# Patient Record
Sex: Male | Born: 1960 | Race: Black or African American | Hispanic: No | Marital: Married | Smoking: Never smoker
Health system: Southern US, Community
[De-identification: ages and names within clinical notes are randomized; demographics above are authoritative.]

## PROBLEM LIST (undated history)

## (undated) DIAGNOSIS — C61 Malignant neoplasm of prostate: Secondary | ICD-10-CM

## (undated) DIAGNOSIS — I10 Essential (primary) hypertension: Secondary | ICD-10-CM

## (undated) DIAGNOSIS — J40 Bronchitis, not specified as acute or chronic: Secondary | ICD-10-CM

## (undated) DIAGNOSIS — E7800 Pure hypercholesterolemia, unspecified: Secondary | ICD-10-CM

## (undated) DIAGNOSIS — K219 Gastro-esophageal reflux disease without esophagitis: Secondary | ICD-10-CM

## (undated) DIAGNOSIS — K635 Polyp of colon: Secondary | ICD-10-CM

## (undated) DIAGNOSIS — C801 Malignant (primary) neoplasm, unspecified: Secondary | ICD-10-CM

## (undated) DIAGNOSIS — K429 Umbilical hernia without obstruction or gangrene: Secondary | ICD-10-CM

## (undated) HISTORY — PX: TONSILLECTOMY: SUR1361

## (undated) HISTORY — PX: HERNIA REPAIR: SHX51

## (undated) HISTORY — PX: COLONOSCOPY W/ BIOPSIES AND POLYPECTOMY: SHX1376

## (undated) HISTORY — PX: PROSTATE SURGERY: SHX751

---

## 2010-01-08 ENCOUNTER — Emergency Department (HOSPITAL_BASED_OUTPATIENT_CLINIC_OR_DEPARTMENT_OTHER): Admission: EM | Admit: 2010-01-08 | Discharge: 2010-01-08 | Payer: Self-pay | Admitting: Emergency Medicine

## 2010-01-19 ENCOUNTER — Ambulatory Visit: Payer: Self-pay | Admitting: Diagnostic Radiology

## 2010-01-19 ENCOUNTER — Emergency Department (HOSPITAL_BASED_OUTPATIENT_CLINIC_OR_DEPARTMENT_OTHER): Admission: EM | Admit: 2010-01-19 | Discharge: 2010-01-19 | Payer: Self-pay | Admitting: Emergency Medicine

## 2010-08-14 ENCOUNTER — Emergency Department (INDEPENDENT_AMBULATORY_CARE_PROVIDER_SITE_OTHER): Payer: No Typology Code available for payment source

## 2010-08-14 ENCOUNTER — Emergency Department (HOSPITAL_BASED_OUTPATIENT_CLINIC_OR_DEPARTMENT_OTHER)
Admission: EM | Admit: 2010-08-14 | Discharge: 2010-08-14 | Disposition: A | Discharge status: Home / Self Care | Payer: No Typology Code available for payment source | Attending: Emergency Medicine | Admitting: Emergency Medicine

## 2010-08-14 DIAGNOSIS — M25539 Pain in unspecified wrist: Secondary | ICD-10-CM

## 2010-08-14 DIAGNOSIS — I10 Essential (primary) hypertension: Secondary | ICD-10-CM | POA: Insufficient documentation

## 2010-08-14 DIAGNOSIS — S060X0A Concussion without loss of consciousness, initial encounter: Secondary | ICD-10-CM | POA: Insufficient documentation

## 2010-08-14 DIAGNOSIS — S335XXA Sprain of ligaments of lumbar spine, initial encounter: Secondary | ICD-10-CM | POA: Insufficient documentation

## 2010-08-14 DIAGNOSIS — Y92410 Unspecified street and highway as the place of occurrence of the external cause: Secondary | ICD-10-CM | POA: Insufficient documentation

## 2010-08-14 DIAGNOSIS — M542 Cervicalgia: Secondary | ICD-10-CM

## 2010-08-14 DIAGNOSIS — R51 Headache: Secondary | ICD-10-CM

## 2010-08-14 DIAGNOSIS — S139XXA Sprain of joints and ligaments of unspecified parts of neck, initial encounter: Secondary | ICD-10-CM | POA: Insufficient documentation

## 2010-08-14 DIAGNOSIS — E7800 Pure hypercholesterolemia, unspecified: Secondary | ICD-10-CM | POA: Insufficient documentation

## 2011-04-01 ENCOUNTER — Emergency Department (HOSPITAL_BASED_OUTPATIENT_CLINIC_OR_DEPARTMENT_OTHER)
Admission: EM | Admit: 2011-04-01 | Discharge: 2011-04-02 | Disposition: A | Discharge status: Home / Self Care | Payer: Self-pay | Attending: Emergency Medicine | Admitting: Emergency Medicine

## 2011-04-01 ENCOUNTER — Encounter: Payer: Self-pay | Admitting: *Deleted

## 2011-04-01 DIAGNOSIS — R1310 Dysphagia, unspecified: Secondary | ICD-10-CM | POA: Insufficient documentation

## 2011-04-01 DIAGNOSIS — I10 Essential (primary) hypertension: Secondary | ICD-10-CM | POA: Insufficient documentation

## 2011-04-01 HISTORY — DX: Bronchitis, not specified as acute or chronic: J40

## 2011-04-01 HISTORY — DX: Essential (primary) hypertension: I10

## 2011-04-01 NOTE — ED Notes (Signed)
Pt states that something flew into his mouth about 7 months ago and since then he has had increasing pain. Seen here for same.

## 2011-04-02 ENCOUNTER — Emergency Department (INDEPENDENT_AMBULATORY_CARE_PROVIDER_SITE_OTHER): Payer: Self-pay

## 2011-04-02 DIAGNOSIS — R05 Cough: Secondary | ICD-10-CM

## 2011-04-02 MED ORDER — OXYCODONE-ACETAMINOPHEN 5-325 MG PO TABS: 2.0000 | ORAL_TABLET | Freq: Three times a day (TID) | ORAL | Status: AC | PRN

## 2011-04-02 NOTE — ED Provider Notes (Signed)
History    Scribed for Cole Horn, MD, the patient was seen in room MH09/MH09. This chart was scribed by Katha Cabal.   CSN: [id number] Arrival date & time: 04/01/2011 10:45 PM   First MD Initiated Contact with Patient 04/02/11 0001      Chief Complaint  Patient presents with  . Sore Throat    (Consider location/radiation/quality/duration/timing/severity/associated sxs/prior treatment) Patient is a 50 y.o. male presenting with pharyngitis.  Sore Throat Chronicity: ongoing  Episode onset: 7 months ago  The problem occurs constantly. The problem has been gradually worsening. Pertinent negatives include no abdominal pain. Associated symptoms comments: difficulty swallowing, cough, congestion, rhinorrhea, no dental pain,. The symptoms are aggravated by swallowing.  Patient reports pain at right base of neck when swallowing.  Patient reports feeling a mild globus sensation in lower neck with mild irritation and gradual onset of odynophagia for last several months.  Patient also reports persistent cough for last several months.  Patient reports congestion and rhinorrhea for last several days.  Patient reports prior ED visit for same.  Patient has not been evaluated by ENT.  Patient has history of HTN.  There is no history of smoking.  There is no radiation of his mild neck pain and no prior treatment. His pain is mild to moderate.    Past Medical History  Diagnosis Date  . Hypertension   . Bronchitis     Past Surgical History  Procedure Date  . Prostate surgery     History reviewed. No pertinent family history.  History  Substance Use Topics  . Smoking status: Never Smoker   . Smokeless tobacco: Not on file  . Alcohol Use: No      Review of Systems  Constitutional: Negative for fever.  HENT: Positive for sore throat and rhinorrhea. Negative for trouble swallowing and dental problem.   Respiratory: Positive for cough.   Gastrointestinal: Negative for abdominal pain.    Skin: Negative for rash.  Psychiatric/Behavioral: Negative for hallucinations.    Allergies  Review of patient's allergies indicates no known allergies.  Home Medications  No current outpatient prescriptions on file.  BP 145/98  Pulse 56  Temp(Src) 98.2 F (36.8 C) (Oral)  Resp 20  Ht 5\' 11"  (1.803 m)  Wt 225 lb (102.059 kg)  BMI 31.38 kg/m2  SpO2 100%  Physical Exam  Nursing note and vitals reviewed. Constitutional:       Awake, alert, nontoxic appearance.  HENT:  Head: Atraumatic.  Eyes: Right eye exhibits no discharge. Left eye exhibits no discharge.  Neck: Neck supple. No thyromegaly present.  Cardiovascular: Normal rate and regular rhythm.   No murmur heard. Pulmonary/Chest: Effort normal and breath sounds normal. He has no wheezes. He has no rales. He exhibits no tenderness.  Abdominal: Soft. Bowel sounds are normal. There is no tenderness. There is no rebound.  Musculoskeletal: He exhibits no tenderness.       Baseline ROM, no obvious new focal weakness.  Lymphadenopathy:    He has no cervical adenopathy.  Neurological:       Mental status and motor strength appears baseline for patient and situation.  Skin: No rash noted.  Psychiatric: He has a normal mood and affect.   the patient's oropharynx is noninjected his dentition is intact and nontender he has normal jaw opening normal jaw occlusion TMJs are nontender no facial tenderness or swelling no neck tenderness or swelling or lymphadenopathy his neck is supple with good range of motion with no palpable  deformity and no thyromegaly noted.  ED Course  Procedures (including critical care time)   DIAGNOSTIC STUDIES: Oxygen Saturation is 100% on room air, normal by my interpretation.     COORDINATION OF CARE: 12:18 AM  Physical exam complete.  Will order CXR.   12:35 AM  Plan to discharge patient.  Patient agrees with plan.      LABS / RADIOLOGY:   Labs Reviewed - No data to display Dg Chest 2  View  04/02/2011  *RADIOLOGY REPORT*  Clinical Data: Cough.  CHEST - 2 VIEW  Comparison: 01/19/2010  Findings: The lungs are clear without focal infiltrate, edema, pneumothorax or pleural effusion.  Linear atelectasis or scarring is seen in the right midlung. The cardiopericardial silhouette is within normal limits for size. Imaged bony structures of the thorax are intact.  IMPRESSION: No acute findings.  Original Report Authenticated By: ERIC A. MANSELL, M.D.         MDM   I doubt any other EMC precluding discharge at this time including, but not necessarily limited to the following:deep space neck infection, airway compromise, SBI.       IMPRESSION: 1. Odynophagia        I personally performed the services described in this documentation, which was scribed in my presence. The recorded information has been reviewed and considered.             Cole Horn, MD 04/02/11 [phone]

## 2012-07-13 ENCOUNTER — Emergency Department (HOSPITAL_BASED_OUTPATIENT_CLINIC_OR_DEPARTMENT_OTHER)
Admission: EM | Admit: 2012-07-13 | Discharge: 2012-07-13 | Disposition: A | Discharge status: Home / Self Care | Payer: Self-pay | Attending: Emergency Medicine | Admitting: Emergency Medicine

## 2012-07-13 ENCOUNTER — Encounter (HOSPITAL_BASED_OUTPATIENT_CLINIC_OR_DEPARTMENT_OTHER): Payer: Self-pay | Admitting: *Deleted

## 2012-07-13 DIAGNOSIS — J00 Acute nasopharyngitis [common cold]: Secondary | ICD-10-CM | POA: Insufficient documentation

## 2012-07-13 DIAGNOSIS — I10 Essential (primary) hypertension: Secondary | ICD-10-CM | POA: Insufficient documentation

## 2012-07-13 DIAGNOSIS — Z8709 Personal history of other diseases of the respiratory system: Secondary | ICD-10-CM | POA: Insufficient documentation

## 2012-07-13 DIAGNOSIS — J029 Acute pharyngitis, unspecified: Secondary | ICD-10-CM | POA: Insufficient documentation

## 2012-07-13 DIAGNOSIS — H9209 Otalgia, unspecified ear: Secondary | ICD-10-CM | POA: Insufficient documentation

## 2012-07-13 DIAGNOSIS — Z760 Encounter for issue of repeat prescription: Secondary | ICD-10-CM | POA: Insufficient documentation

## 2012-07-13 DIAGNOSIS — J3489 Other specified disorders of nose and nasal sinuses: Secondary | ICD-10-CM | POA: Insufficient documentation

## 2012-07-13 DIAGNOSIS — J019 Acute sinusitis, unspecified: Secondary | ICD-10-CM | POA: Insufficient documentation

## 2012-07-13 MED ORDER — AMOXICILLIN 500 MG PO CAPS: 500.0000 mg | ORAL_CAPSULE | Freq: Three times a day (TID) | ORAL | Status: DC

## 2012-07-13 MED ORDER — FLUTICASONE PROPIONATE 50 MCG/ACT NA SUSP: 2.0000 | Freq: Every day | NASAL | Status: AC

## 2012-07-13 MED ORDER — AMLODIPINE BESYLATE 10 MG PO TABS: 10.0000 mg | ORAL_TABLET | Freq: Every day | ORAL | Status: AC

## 2012-07-13 NOTE — ED Provider Notes (Signed)
History     CSN: [id number]  Arrival date & time 07/13/12  2130   First MD Initiated Contact with Patient 07/13/12 2201      Chief Complaint  Patient presents with  . Cough    (Consider location/radiation/quality/duration/timing/severity/associated sxs/prior treatment) HPI Comments: Pt presents to the ED for cough and congestion x 7 days.  States he is having sinus pressure, ear pain, nasal congestion and sore throat.  Pt also complains that he is out of his blood pressure medication because he has been unable to get an appointment with his doctor.  Denies any chest pain, palpitations, SOB, nausea, vomiting, diarrhea, abdominal pain, dizziness, or weakness.  Patient is a 52 y.o. male presenting with cough. The history is provided by the patient.  Cough   Past Medical History  Diagnosis Date  . Hypertension   . Bronchitis     Past Surgical History  Procedure Laterality Date  . Prostate surgery      History reviewed. No pertinent family history.  History  Substance Use Topics  . Smoking status: Never Smoker   . Smokeless tobacco: Not on file  . Alcohol Use: No      Review of Systems  HENT: Positive for congestion and sinus pressure.   Respiratory: Positive for cough.   All other systems reviewed and are negative.    Allergies  Review of patient's allergies indicates no known allergies.  Home Medications  No current outpatient prescriptions on file.  BP 154/98  Pulse 48  Temp(Src) 97.7 F (36.5 C) (Oral)  Ht 5\' 11"  (1.803 m)  Wt 203 lb 5 oz (92.222 kg)  BMI 28.37 kg/m2  SpO2 100%  Physical Exam  Nursing note and vitals reviewed. Constitutional: He is oriented to person, place, and time. He appears well-developed and well-nourished.  HENT:  Head: Normocephalic and atraumatic.  Right Ear: Tympanic membrane and ear canal normal.  Left Ear: Tympanic membrane and ear canal normal.  Mouth/Throat: Uvula is midline, oropharynx is clear and moist and mucous  membranes are normal. No oropharyngeal exudate, posterior oropharyngeal edema, posterior oropharyngeal erythema or tonsillar abscesses.  Turbinates swollen and erythematous, mucus drainage noted in the posterior oropharynx  Eyes: Conjunctivae and EOM are normal. Pupils are equal, round, and reactive to light.  Neck: Normal range of motion.  Cardiovascular: Normal rate, regular rhythm and normal heart sounds.   Pulmonary/Chest: Effort normal and breath sounds normal. He has no wheezes.  Abdominal: Soft. Bowel sounds are normal.  Neurological: He is alert and oriented to person, place, and time.  Skin: Skin is warm and dry.  Psychiatric: He has a normal mood and affect.    ED Course  Procedures (including critical care time)  Labs Reviewed - No data to display No results found.   1. Acute sinus infection   2. Hypertension       MDM   Pt presenting with cough, cold, congestion x 7 days.  Not tried any OTC medications due to HTN.  Pt is also out of his medication- takes amlodipine 10mg  daily.  Rx amoxicillin x 10d and flonase.  Will give brief refill of HTN meds but instructed that he must follow up with his PCP for further refills.  Return precautions advised.        Garlon Hatchet, PA-C 07/13/12 2352

## 2012-07-13 NOTE — ED Notes (Signed)
Cough, cold, congestion since Friday

## 2012-07-14 NOTE — ED Provider Notes (Signed)
Medical screening examination/treatment/procedure(s) were performed by non-physician practitioner and as supervising physician I was immediately available for consultation/collaboration.   Janaisa Birkland L Michole Lecuyer, MD 07/14/12 0559 

## 2013-09-17 ENCOUNTER — Encounter (HOSPITAL_BASED_OUTPATIENT_CLINIC_OR_DEPARTMENT_OTHER): Payer: Self-pay | Admitting: Emergency Medicine

## 2013-09-17 ENCOUNTER — Emergency Department (HOSPITAL_BASED_OUTPATIENT_CLINIC_OR_DEPARTMENT_OTHER)
Admission: EM | Admit: 2013-09-17 | Discharge: 2013-09-18 | Disposition: A | Discharge status: Home / Self Care | Payer: BC Managed Care – PPO | Attending: Emergency Medicine | Admitting: Emergency Medicine

## 2013-09-17 DIAGNOSIS — K5900 Constipation, unspecified: Secondary | ICD-10-CM | POA: Insufficient documentation

## 2013-09-17 DIAGNOSIS — Z8709 Personal history of other diseases of the respiratory system: Secondary | ICD-10-CM | POA: Insufficient documentation

## 2013-09-17 DIAGNOSIS — Z860100 Personal history of colon polyps, unspecified: Secondary | ICD-10-CM | POA: Insufficient documentation

## 2013-09-17 DIAGNOSIS — Z79899 Other long term (current) drug therapy: Secondary | ICD-10-CM | POA: Insufficient documentation

## 2013-09-17 DIAGNOSIS — Z8546 Personal history of malignant neoplasm of prostate: Secondary | ICD-10-CM | POA: Insufficient documentation

## 2013-09-17 DIAGNOSIS — E7800 Pure hypercholesterolemia, unspecified: Secondary | ICD-10-CM | POA: Insufficient documentation

## 2013-09-17 DIAGNOSIS — Z791 Long term (current) use of non-steroidal anti-inflammatories (NSAID): Secondary | ICD-10-CM | POA: Insufficient documentation

## 2013-09-17 DIAGNOSIS — K602 Anal fissure, unspecified: Secondary | ICD-10-CM | POA: Insufficient documentation

## 2013-09-17 DIAGNOSIS — Z792 Long term (current) use of antibiotics: Secondary | ICD-10-CM | POA: Insufficient documentation

## 2013-09-17 DIAGNOSIS — IMO0002 Reserved for concepts with insufficient information to code with codable children: Secondary | ICD-10-CM | POA: Insufficient documentation

## 2013-09-17 DIAGNOSIS — I10 Essential (primary) hypertension: Secondary | ICD-10-CM | POA: Insufficient documentation

## 2013-09-17 DIAGNOSIS — Z8719 Personal history of other diseases of the digestive system: Secondary | ICD-10-CM | POA: Insufficient documentation

## 2013-09-17 HISTORY — DX: Malignant neoplasm of prostate: C61

## 2013-09-17 HISTORY — DX: Pure hypercholesterolemia, unspecified: E78.00

## 2013-09-17 HISTORY — DX: Polyp of colon: K63.5

## 2013-09-17 HISTORY — DX: Umbilical hernia without obstruction or gangrene: K42.9

## 2013-09-17 HISTORY — DX: Malignant (primary) neoplasm, unspecified: C80.1

## 2013-09-17 NOTE — ED Notes (Signed)
Severe rectal bleeding for 30 min that started at 7pm. .  Sts he had a colonoscopy a week ago with polyps removed.  No bleeding afterward until tonight. Pt used a preparation H suppository after bleeding episode.  Pt trembling but denies pain.

## 2013-09-18 ENCOUNTER — Encounter (HOSPITAL_BASED_OUTPATIENT_CLINIC_OR_DEPARTMENT_OTHER): Payer: Self-pay | Admitting: Emergency Medicine

## 2013-09-18 ENCOUNTER — Emergency Department (HOSPITAL_BASED_OUTPATIENT_CLINIC_OR_DEPARTMENT_OTHER): Payer: BC Managed Care – PPO

## 2013-09-18 LAB — CBC WITH DIFFERENTIAL/PLATELET
BASOS ABS: 0 10*3/uL (ref 0.0–0.1)
BASOS PCT: 0 % (ref 0–1)
EOS ABS: 0 10*3/uL (ref 0.0–0.7)
EOS PCT: 1 % (ref 0–5)
HCT: 41.7 % (ref 39.0–52.0)
Hemoglobin: 15.2 g/dL (ref 13.0–17.0)
LYMPHS ABS: 0.6 10*3/uL — AB (ref 0.7–4.0)
Lymphocytes Relative: 7 % — ABNORMAL LOW (ref 12–46)
MCH: 28.1 pg (ref 26.0–34.0)
MCHC: 36.5 g/dL — ABNORMAL HIGH (ref 30.0–36.0)
MCV: 77.1 fL — ABNORMAL LOW (ref 78.0–100.0)
Monocytes Absolute: 0.6 10*3/uL (ref 0.1–1.0)
Monocytes Relative: 7 % (ref 3–12)
NEUTROS PCT: 86 % — AB (ref 43–77)
Neutro Abs: 7.5 10*3/uL (ref 1.7–7.7)
PLATELETS: 206 10*3/uL (ref 150–400)
RBC: 5.41 MIL/uL (ref 4.22–5.81)
RDW: 12.3 % (ref 11.5–15.5)
WBC: 8.7 10*3/uL (ref 4.0–10.5)

## 2013-09-18 LAB — BASIC METABOLIC PANEL
BUN: 14 mg/dL (ref 6–23)
CALCIUM: 9.4 mg/dL (ref 8.4–10.5)
CO2: 24 mEq/L (ref 19–32)
Chloride: 102 mEq/L (ref 96–112)
Creatinine, Ser: 1.3 mg/dL (ref 0.50–1.35)
GFR, EST AFRICAN AMERICAN: 71 mL/min — AB (ref >90)
GFR, EST NON AFRICAN AMERICAN: 62 mL/min — AB (ref >90)
GLUCOSE: 105 mg/dL — AB (ref 70–99)
POTASSIUM: 3.7 meq/L (ref 3.7–5.3)
SODIUM: 139 meq/L (ref 137–147)

## 2013-09-18 LAB — OCCULT BLOOD X 1 CARD TO LAB, STOOL: Fecal Occult Bld: POSITIVE — AB

## 2013-09-18 MED ORDER — POLYETHYLENE GLYCOL 3350 17 GM/SCOOP PO POWD: 1.0000 | Freq: Once | ORAL | Status: DC

## 2013-09-18 MED ORDER — TRAMADOL HCL 50 MG PO TABS
50.0000 mg | ORAL_TABLET | Freq: Once | ORAL | Status: AC
  Administered 2013-09-18: 50 mg via ORAL
  Filled 2013-09-18: qty 1

## 2013-09-18 NOTE — Discharge Instructions (Signed)
Anal Fissure, Adult An anal fissure is a small tear or crack in the skin around the anus. Bleeding from a fissure usually stops on its own within a few minutes. However, bleeding will often reoccur with each bowel movement until the crack heals.  CAUSES   Passing large, hard stools.  Frequent diarrheal stools.  Constipation.  Inflammatory bowel disease (Crohn's disease or ulcerative colitis).  Infections.  Anal sex. SYMPTOMS   Small amounts of blood seen on your stools, on toilet paper, or in the toilet after a bowel movement.  Rectal bleeding.  Painful bowel movements.  Itching or irritation around the anus. DIAGNOSIS Your caregiver will examine the anal area. An anal fissure can usually be seen with careful inspection. A rectal exam may be performed and a short tube (anoscope) may be used to examine the anal canal. TREATMENT   You may be instructed to take fiber supplements. These supplements can soften your stool to help make bowel movements easier.  Sitz baths may be recommended to help heal the tear. Do not use soap in the sitz baths.  A medicated cream or ointment may be prescribed to lessen discomfort. HOME CARE INSTRUCTIONS   Maintain a diet high in fruits, whole grains, and vegetables. Avoid constipating foods like bananas and dairy products.  Take sitz baths as directed by your caregiver.  Drink enough fluids to keep your urine clear or pale yellow.  Only take over-the-counter or prescription medicines for pain, discomfort, or fever as directed by your caregiver. Do not take aspirin as this may increase bleeding.  Do not use ointments containing numbing medications (anesthetics) or hydrocortisone. They could slow healing. SEEK MEDICAL CARE IF:   Your fissure is not completely healed within 3 days.  You have further bleeding.  You have a fever.  You have diarrhea mixed with blood.  You have pain.  Your problem is getting worse rather than  better. MAKE SURE YOU:   Understand these instructions.  Will watch your condition.  Will get help right away if you are not doing well or get worse. Document Released: 04/09/2005 Document Revised: 07/02/2011 Document Reviewed: 09/24/2010 [facility] Patient Information 2014 [place], [place].  Fiber Content in Foods Drinking plenty of fluids and consuming foods high in fiber can help with constipation. See the list below for the fiber content of some common foods. Starches and Grains / Dietary Fiber (g)  Cheerios, 1 cup / 3 g  Kellogg's Corn Flakes, 1 cup / 0.7 g  Rice Krispies, 1  cup / 0.3 g  Quaker Oat Life Cereal,  cup / 2.1 g  Oatmeal, instant (cooked),  cup / 2 g  Kellogg's Frosted Mini Wheats, 1 cup / 5.1 g  Rice, brown, long-grain (cooked), 1 cup / 3.5 g  Rice, white, long-grain (cooked), 1 cup / 0.6 g  Macaroni, cooked, enriched, 1 cup / 2.5 g Legumes / Dietary Fiber (g)  Beans, baked, canned, plain or vegetarian,  cup / 5.2 g  Beans, kidney, canned,  cup / 6.8 g  Beans, pinto, dried (cooked),  cup / 7.7 g  Beans, pinto, canned,  cup / 5.5 g Breads and Crackers / Dietary Fiber (g)  Graham crackers, plain or honey, 2 squares / 0.7 g  Saltine crackers, 3 squares / 0.3 g  Pretzels, plain, salted, 10 pieces / 1.8 g  Bread, whole-wheat, 1 slice / 1.9 g  Bread, white, 1 slice / 0.7 g  Bread, raisin, 1 slice / 1.2 g  Bagel,  plain, 3 oz / 2 g  Tortilla, flour, 1 oz / 0.9 g  Tortilla, corn, 1 small / 1.5 g  Bun, hamburger or hotdog, 1 small / 0.9 g Fruits / Dietary Fiber (g)  Apple, raw with skin, 1 medium / 4.4 g  Applesauce, sweetened,  cup / 1.5 g  Banana,  medium / 1.5 g  Grapes, 10 grapes / 0.4 g  Orange, 1 small / 2.3 g  Raisin, 1.5 oz / 1.6 g  Melon, 1 cup / 1.4 g Vegetables / Dietary Fiber (g)  Green beans, canned,  cup / 1.3 g  Carrots (cooked),  cup / 2.3 g  Broccoli (cooked),  cup / 2.8 g  Peas, frozen (cooked),   cup / 4.4 g  Potatoes, mashed,  cup / 1.6 g  Lettuce, 1 cup / 0.5 g  Corn, canned,  cup / 1.6 g  Tomato,  cup / 1.1 g Document Released: 08/26/2006 Document Revised: 07/02/2011 Document Reviewed: 10/21/2006 [employer] Patient Information 2014 [place].  Constipation, Adult Constipation is when a person:  Poops (bowel movement) less than 3 times a week.  Has a hard time pooping.  Has poop that is dry, hard, or bigger than normal. HOME CARE   Eat more fiber, such as fruits, vegetables, whole grains like brown rice, and beans.  Eat less fatty foods and sugar. This includes [place] fries, hamburgers, cookies, candy, and soda.  If you are not getting enough fiber from food, take products with added fiber in them (supplements).  Drink enough fluid to keep your pee (urine) clear or pale yellow.  Go to the restroom when you feel like you need to poop. Do not hold it.  Only take medicine as told by your doctor. Do not take medicines that help you poop (laxatives) without talking to your doctor first.  Exercise on a regular basis, or as told by your doctor. GET HELP RIGHT AWAY IF:   You have bright red blood in your poop (stool).  Your constipation lasts more than 4 days or gets worse.  You have belly (abdomen) or butt (rectal) pain.  You have thin poop (as thin as a pencil).  You lose weight, and it cannot be explained. MAKE SURE YOU:   Understand these instructions.  Will watch your condition.  Will get help right away if you are not doing well or get worse. Document Released: 09/26/2007 Document Revised: 07/02/2011 Document Reviewed: 01/19/2013 [facility] Patient Information 2014 [place], [place].

## 2013-09-18 NOTE — ED Notes (Signed)
MD at bedside. 

## 2013-09-18 NOTE — ED Notes (Signed)
MD at bedside discussing test results and giving detailed discharge instructions.

## 2013-09-18 NOTE — ED Provider Notes (Signed)
CSN: [id number]     Arrival date & time 09/17/13  2159 History   First MD Initiated Contact with Patient 09/18/13 0000     Chief Complaint  Patient presents with  . Rectal Bleeding     (Consider location/radiation/quality/duration/timing/severity/associated sxs/prior Treatment) Patient is a 53 y.o. male presenting with hematochezia. The history is provided by the patient.  Rectal Bleeding Quality:  Bright red Amount:  Moderate Timing:  Constant Progression:  Resolved Chronicity:  New Context: constipation   Context: not diarrhea   Relieved by:  Nothing Worsened by:  Nothing tried Ineffective treatments:  None tried Associated symptoms: no abdominal pain and no vomiting   Risk factors: no anticoagulant use   Was straining to have BM as has had hard stool for months.  After 30 minutes of straining had red blood in toilet  Past Medical History  Diagnosis Date  . Hypertension   . Bronchitis   . Colon polyps   . Cancer   . Prostate cancer   . High cholesterol   . Umbilical hernia    Past Surgical History  Procedure Laterality Date  . Prostate surgery    . Colonoscopy w/ biopsies and polypectomy    . Hernia repair     No family history on file. History  Substance Use Topics  . Smoking status: Never Smoker   . Smokeless tobacco: Not on file  . Alcohol Use: No    Review of Systems  Gastrointestinal: Positive for hematochezia. Negative for vomiting and abdominal pain.  All other systems reviewed and are negative.     Allergies  Iodine and Ivp dye  Home Medications   Prior to Admission medications   Medication Sig Start Date End Date Taking? Authorizing Provider  atorvastatin (LIPITOR) 40 MG tablet Take 40 mg by mouth daily.   Yes Historical Provider, MD  meloxicam (MOBIC) 15 MG tablet Take 15 mg by mouth daily.   Yes Historical Provider, MD  amLODipine (NORVASC) 10 MG tablet Take 1 tablet (10 mg total) by mouth daily. 07/13/12   Larene Pickett, PA-C   amoxicillin (AMOXIL) 500 MG capsule Take 1 capsule (500 mg total) by mouth 3 (three) times daily. 07/13/12   Larene Pickett, PA-C  fluticasone (FLONASE) 50 MCG/ACT nasal spray Place 2 sprays into the nose daily. 07/13/12   Larene Pickett, PA-C   BP 133/78  Pulse 75  Temp(Src) 99.5 F (37.5 C) (Oral)  Resp 22  Ht 5\' 11"  (1.803 m)  Wt 208 lb (94.348 kg)  BMI 29.02 kg/m2  SpO2 99% Physical Exam  Constitutional: He is oriented to person, place, and time. He appears well-developed and well-nourished. No distress.  HENT:  Head: Normocephalic and atraumatic.  Mouth/Throat: Oropharynx is clear and moist.  Eyes: Conjunctivae are normal. Pupils are equal, round, and reactive to light.  Neck: Normal range of motion. Neck supple.  Cardiovascular: Normal rate, regular rhythm and intact distal pulses.   Pulmonary/Chest: Effort normal and breath sounds normal. He has no wheezes. He has no rales.  Abdominal: Soft. Bowel sounds are normal. There is no tenderness. There is no rebound and no guarding.  Genitourinary: Guaiac positive stool.  Anal fissure with hard stool in vault  Musculoskeletal: Normal range of motion.  Neurological: He is alert and oriented to person, place, and time.  Skin: Skin is warm and dry.  Psychiatric: He has a normal mood and affect.    ED Course  Procedures (including critical care time) Labs Review Labs  Reviewed  OCCULT BLOOD X 1 CARD TO LAB, STOOL - Abnormal; Notable for the following:    Fecal Occult Bld POSITIVE (*)    All other components within normal limits  CBC WITH DIFFERENTIAL - Abnormal; Notable for the following:    MCV 77.1 (*)    MCHC 36.5 (*)    Neutrophils Relative % 86 (*)    Lymphocytes Relative 7 (*)    Lymphs Abs 0.6 (*)    All other components within normal limits  BASIC METABOLIC PANEL - Abnormal; Notable for the following:    Glucose, Bld 105 (*)    GFR calc non Af Amer 62 (*)    GFR calc Af Amer 71 (*)    All other components within  normal limits    Imaging Review Dg Abd Acute W/chest  09/18/2013   CLINICAL DATA:  Abdominal pain, constipation.  EXAM: ACUTE ABDOMEN SERIES (ABDOMEN 2 VIEW & CHEST 1 VIEW)  COMPARISON:  Prior radiograph from 04/02/2011  FINDINGS: The cardiac and mediastinal silhouettes are stable in size and contour, and remain within normal limits.  The lungs are normally inflated. No airspace consolidation, pleural effusion, or pulmonary edema is identified. There is no pneumothorax.  No evidence of obstruction or ileus. Moderate to large amount of retained stool seen diffusely throughout the colon, suggesting constipation. No abnormal bowel Tayag thickening. No free intraperitoneal air. No soft tissue mass or abnormal calcification. Sequelae of prior prostate surgery overlie the pelvis.  No acute osseous abnormality.  IMPRESSION: 1. Nonobstructive bowel gas pattern. Large amount of retained stool throughout the colon, suggesting constipation. 2. No acute cardiopulmonary abnormality identified.   Electronically Signed   By: Jeannine Boga M.D.   On: 09/18/2013 01:38     EKG Interpretation None      MDM   Final diagnoses:  None    Anal fissure. Hb is high normal.  Follow up with your doctor in the am start miralax daily.  Return for weakness fever abdominal pain or intractable bleeding   Quintus Premo K Tali Cleaves-Rasch, MD 09/18/13 [id number]

## 2014-10-13 ENCOUNTER — Other Ambulatory Visit: Payer: Self-pay

## 2014-10-13 ENCOUNTER — Emergency Department (HOSPITAL_BASED_OUTPATIENT_CLINIC_OR_DEPARTMENT_OTHER)
Admission: EM | Admit: 2014-10-13 | Discharge: 2014-10-14 | Disposition: A | Discharge status: Home / Self Care | Payer: BLUE CROSS/BLUE SHIELD | Attending: Emergency Medicine | Admitting: Emergency Medicine

## 2014-10-13 ENCOUNTER — Encounter (HOSPITAL_BASED_OUTPATIENT_CLINIC_OR_DEPARTMENT_OTHER): Payer: Self-pay | Admitting: *Deleted

## 2014-10-13 DIAGNOSIS — Z79899 Other long term (current) drug therapy: Secondary | ICD-10-CM | POA: Diagnosis not present

## 2014-10-13 DIAGNOSIS — I10 Essential (primary) hypertension: Secondary | ICD-10-CM | POA: Insufficient documentation

## 2014-10-13 DIAGNOSIS — J40 Bronchitis, not specified as acute or chronic: Secondary | ICD-10-CM | POA: Insufficient documentation

## 2014-10-13 DIAGNOSIS — Z8639 Personal history of other endocrine, nutritional and metabolic disease: Secondary | ICD-10-CM | POA: Insufficient documentation

## 2014-10-13 DIAGNOSIS — Z8546 Personal history of malignant neoplasm of prostate: Secondary | ICD-10-CM | POA: Diagnosis not present

## 2014-10-13 DIAGNOSIS — Z7951 Long term (current) use of inhaled steroids: Secondary | ICD-10-CM | POA: Insufficient documentation

## 2014-10-13 DIAGNOSIS — Z86010 Personal history of colonic polyps: Secondary | ICD-10-CM | POA: Diagnosis not present

## 2014-10-13 DIAGNOSIS — R05 Cough: Secondary | ICD-10-CM | POA: Diagnosis present

## 2014-10-13 DIAGNOSIS — Z8719 Personal history of other diseases of the digestive system: Secondary | ICD-10-CM | POA: Diagnosis not present

## 2014-10-13 NOTE — ED Notes (Addendum)
Pt c/o cough x 1 year  C/o SOB and chest pain x 2 days

## 2014-10-14 ENCOUNTER — Emergency Department (HOSPITAL_BASED_OUTPATIENT_CLINIC_OR_DEPARTMENT_OTHER): Payer: BLUE CROSS/BLUE SHIELD

## 2014-10-14 LAB — CBC
HEMATOCRIT: 43.2 % (ref 39.0–52.0)
HEMOGLOBIN: 15.4 g/dL (ref 13.0–17.0)
MCH: 27.1 pg (ref 26.0–34.0)
MCHC: 35.6 g/dL (ref 30.0–36.0)
MCV: 76.1 fL — AB (ref 78.0–100.0)
Platelets: 224 10*3/uL (ref 150–400)
RBC: 5.68 MIL/uL (ref 4.22–5.81)
RDW: 12.6 % (ref 11.5–15.5)
WBC: 5.6 10*3/uL (ref 4.0–10.5)

## 2014-10-14 LAB — BASIC METABOLIC PANEL
Anion gap: 11 (ref 5–15)
BUN: 13 mg/dL (ref 6–20)
CHLORIDE: 102 mmol/L (ref 101–111)
CO2: 26 mmol/L (ref 22–32)
CREATININE: 1.29 mg/dL — AB (ref 0.61–1.24)
Calcium: 9.2 mg/dL (ref 8.9–10.3)
GFR calc non Af Amer: >60 mL/min (ref >60)
GLUCOSE: 110 mg/dL — AB (ref 65–99)
Potassium: 3.7 mmol/L (ref 3.5–5.1)
Sodium: 139 mmol/L (ref 135–145)

## 2014-10-14 LAB — TROPONIN I: Troponin I: <0.03 ng/mL (ref <0.031)

## 2014-10-14 MED ORDER — ALBUTEROL SULFATE HFA 108 (90 BASE) MCG/ACT IN AERS
2.0000 | INHALATION_SPRAY | RESPIRATORY_TRACT | Status: DC | PRN
  Administered 2014-10-14: 2 via RESPIRATORY_TRACT
  Filled 2014-10-14: qty 6.7

## 2014-10-14 NOTE — ED Notes (Signed)
Patient transported to X-ray 

## 2014-10-14 NOTE — ED Provider Notes (Signed)
CSN: [id number]     Arrival date & time 10/13/14  2333 History   First MD Initiated Contact with Patient 10/14/14 [phone]     Chief Complaint  Patient presents with  . Cough     (Consider location/radiation/quality/duration/timing/severity/associated sxs/prior Treatment) HPI  This is a 54 year old male with a history of cough for over a year. The cough tends to be episodic. He has been diagnosed with bronchitis in the past and has a remote history of inhaler use. Symptoms are moderate and he is not aware of any particular triggers. He has also been having some back discomfort which she describes as spasms or tenseness. He states this discomfort is "not bad". This is located in his mid upper back. It is not worse with coughing. He is not a smoker.  Past Medical History  Diagnosis Date  . Hypertension   . Bronchitis   . Colon polyps   . Cancer   . Prostate cancer   . High cholesterol   . Umbilical hernia    Past Surgical History  Procedure Laterality Date  . Prostate surgery    . Colonoscopy w/ biopsies and polypectomy    . Hernia repair     History reviewed. No pertinent family history. History  Substance Use Topics  . Smoking status: Never Smoker   . Smokeless tobacco: Not on file  . Alcohol Use: No    Review of Systems  All other systems reviewed and are negative.   Allergies  Iodine and Ivp dye  Home Medications   Prior to Admission medications   Medication Sig Start Date End Date Taking? Authorizing Provider  ranitidine (ZANTAC) 150 MG tablet Take 150 mg by mouth 2 (two) times daily.   Yes Historical Provider, MD  amLODipine (NORVASC) 10 MG tablet Take 1 tablet (10 mg total) by mouth daily. 07/13/12   Larene Pickett, PA-C  fluticasone (FLONASE) 50 MCG/ACT nasal spray Place 2 sprays into the nose daily. 07/13/12   Larene Pickett, PA-C   BP 130/84 mmHg  Pulse 48  Temp(Src) 98.1 F (36.7 C)  Resp 18  Wt 212 lb (96.163 kg)  SpO2 98%   Physical Exam  General:  Well-developed, well-nourished male in no acute distress; appearance consistent with age of record HENT: normocephalic; atraumatic Eyes: pupils equal, round and reactive to light; extraocular muscles intact Neck: supple Heart: regular rate and rhythm Lungs: Mildly decreased air movement bilaterally; frequent cough Abdomen: soft; nondistended; nontender; bowel sounds present Back: Nontender Extremities: No deformity; full range of motion; pulses normal Neurologic: Awake, alert and oriented; motor function intact in all extremities and symmetric; no facial droop Skin: Warm and dry Psychiatric: Normal mood and affect    ED Course  Procedures (including critical care time)   EKG Interpretation   Date/Time:  Wednesday October 13 2014 23:50:17 EDT Ventricular Rate:  48 PR Interval:  154 QRS Duration: 96 QT Interval:  444 QTC Calculation: 396 R Axis:   26 Text Interpretation:  Sinus bradycardia Otherwise normal ECG No previous  ECGs available Confirmed by Deanthony Maull  MD, Jenny Reichmann ([id number]) on 10/13/2014 11:58:23  PM      MDM   Nursing notes and vitals signs, including pulse oximetry, reviewed.  Summary of this visit's results, reviewed by myself:  Labs:  Results for orders placed or performed during the hospital encounter of 10/13/14 (from the past 24 hour(s))  Troponin I     Status: None   Collection Time: 10/14/14 12:05 AM  Result Value  Ref Range   Troponin I <0.03 <0.031 ng/mL  Basic metabolic panel     Status: Abnormal   Collection Time: 10/14/14  1:10 AM  Result Value Ref Range   Sodium 139 135 - 145 mmol/L   Potassium 3.7 3.5 - 5.1 mmol/L   Chloride 102 101 - 111 mmol/L   CO2 26 22 - 32 mmol/L   Glucose, Bld 110 (H) 65 - 99 mg/dL   BUN 13 6 - 20 mg/dL   Creatinine, Ser 1.29 (H) 0.61 - 1.24 mg/dL   Calcium 9.2 8.9 - 10.3 mg/dL   GFR calc non Af Amer >60 >60 mL/min   GFR calc Af Amer >60 >60 mL/min   Anion gap 11 5 - 15  CBC     Status: Abnormal   Collection Time: 10/14/14   1:10 AM  Result Value Ref Range   WBC 5.6 4.0 - 10.5 K/uL   RBC 5.68 4.22 - 5.81 MIL/uL   Hemoglobin 15.4 13.0 - 17.0 g/dL   HCT 43.2 39.0 - 52.0 %   MCV 76.1 (L) 78.0 - 100.0 fL   MCH 27.1 26.0 - 34.0 pg   MCHC 35.6 30.0 - 36.0 g/dL   RDW 12.6 11.5 - 15.5 %   Platelets 224 150 - 400 K/uL    Imaging Studies: Dg Chest 2 View  10/14/2014   CLINICAL DATA:  Acute onset of shortness of breath and generalized chest pain. Cough. Initial encounter.  EXAM: CHEST  2 VIEW  COMPARISON:  Chest radiograph performed 09/18/2013  FINDINGS: The lungs are well-aerated and clear. There is no evidence of focal opacification, pleural effusion or pneumothorax.  The heart is normal in size; the mediastinal contour is within normal limits. No acute osseous abnormalities are seen.  IMPRESSION: No acute cardiopulmonary process seen.   Electronically Signed   By: Garald Balding M.D.   On: 10/14/2014 02:23       Shanon Rosser, MD 10/14/14 [phone]

## 2014-10-14 NOTE — ED Notes (Signed)
Patient ambulatory to bathroom.

## 2015-02-16 ENCOUNTER — Encounter (HOSPITAL_BASED_OUTPATIENT_CLINIC_OR_DEPARTMENT_OTHER): Payer: Self-pay

## 2015-02-16 ENCOUNTER — Emergency Department (HOSPITAL_BASED_OUTPATIENT_CLINIC_OR_DEPARTMENT_OTHER): Payer: Managed Care, Other (non HMO)

## 2015-02-16 ENCOUNTER — Emergency Department (HOSPITAL_BASED_OUTPATIENT_CLINIC_OR_DEPARTMENT_OTHER)
Admission: EM | Admit: 2015-02-16 | Discharge: 2015-02-17 | Disposition: A | Discharge status: Home / Self Care | Payer: Managed Care, Other (non HMO) | Attending: Emergency Medicine | Admitting: Emergency Medicine

## 2015-02-16 DIAGNOSIS — Z86010 Personal history of colonic polyps: Secondary | ICD-10-CM | POA: Diagnosis not present

## 2015-02-16 DIAGNOSIS — Z79899 Other long term (current) drug therapy: Secondary | ICD-10-CM | POA: Diagnosis not present

## 2015-02-16 DIAGNOSIS — K219 Gastro-esophageal reflux disease without esophagitis: Secondary | ICD-10-CM | POA: Insufficient documentation

## 2015-02-16 DIAGNOSIS — M5412 Radiculopathy, cervical region: Secondary | ICD-10-CM | POA: Diagnosis not present

## 2015-02-16 DIAGNOSIS — I10 Essential (primary) hypertension: Secondary | ICD-10-CM | POA: Diagnosis not present

## 2015-02-16 DIAGNOSIS — Z7951 Long term (current) use of inhaled steroids: Secondary | ICD-10-CM | POA: Insufficient documentation

## 2015-02-16 DIAGNOSIS — Z8546 Personal history of malignant neoplasm of prostate: Secondary | ICD-10-CM | POA: Diagnosis not present

## 2015-02-16 DIAGNOSIS — R079 Chest pain, unspecified: Secondary | ICD-10-CM | POA: Diagnosis present

## 2015-02-16 DIAGNOSIS — Z8639 Personal history of other endocrine, nutritional and metabolic disease: Secondary | ICD-10-CM | POA: Insufficient documentation

## 2015-02-16 DIAGNOSIS — J40 Bronchitis, not specified as acute or chronic: Secondary | ICD-10-CM | POA: Diagnosis not present

## 2015-02-16 HISTORY — DX: Gastro-esophageal reflux disease without esophagitis: K21.9

## 2015-02-16 LAB — BASIC METABOLIC PANEL
Anion gap: 3 — ABNORMAL LOW (ref 5–15)
BUN: 19 mg/dL (ref 6–20)
CHLORIDE: 110 mmol/L (ref 101–111)
CO2: 26 mmol/L (ref 22–32)
CREATININE: 1.35 mg/dL — AB (ref 0.61–1.24)
Calcium: 8.9 mg/dL (ref 8.9–10.3)
GFR calc Af Amer: >60 mL/min (ref >60)
GFR calc non Af Amer: 58 mL/min — ABNORMAL LOW (ref >60)
Glucose, Bld: 90 mg/dL (ref 65–99)
Potassium: 4.2 mmol/L (ref 3.5–5.1)
Sodium: 139 mmol/L (ref 135–145)

## 2015-02-16 LAB — CBC
HEMATOCRIT: 41.5 % (ref 39.0–52.0)
Hemoglobin: 14.6 g/dL (ref 13.0–17.0)
MCH: 26.8 pg (ref 26.0–34.0)
MCHC: 35.2 g/dL (ref 30.0–36.0)
MCV: 76.3 fL — ABNORMAL LOW (ref 78.0–100.0)
PLATELETS: 257 10*3/uL (ref 150–400)
RBC: 5.44 MIL/uL (ref 4.22–5.81)
RDW: 12.6 % (ref 11.5–15.5)
WBC: 7.1 10*3/uL (ref 4.0–10.5)

## 2015-02-16 LAB — TROPONIN I: Troponin I: <0.03 ng/mL (ref <0.031)

## 2015-02-16 MED ORDER — HYDROCOD POLST-CPM POLST ER 10-8 MG/5ML PO SUER: 5.0000 mL | Freq: Two times a day (BID) | ORAL | Status: AC | PRN

## 2015-02-16 MED ORDER — ALBUTEROL SULFATE HFA 108 (90 BASE) MCG/ACT IN AERS
2.0000 | INHALATION_SPRAY | RESPIRATORY_TRACT | Status: DC | PRN
  Administered 2015-02-17: 2 via RESPIRATORY_TRACT
  Filled 2015-02-16: qty 6.7

## 2015-02-16 NOTE — ED Notes (Signed)
C/o chest and neck pain x 2-3 days

## 2015-02-16 NOTE — ED Provider Notes (Signed)
CSN: [id number]     Arrival date & time 02/16/15  2233 History  By signing my name below, I, Terrance Branch, attest that this documentation has been prepared under the direction and in the presence of Shanon Rosser, MD. Electronically Signed: Randa Evens, ED Scribe. 02/16/2015. 11:52 PM.    Chief Complaint  Patient presents with  . Chest Pain   The history is provided by the patient. No language interpreter was used.   HPI Comments: Cole Chung is a 54 y.o. male who presents to the Emergency Department complaining of mild, dull central CP onset 1 week. Pt reports cough for the past two weeks that makes his pain worse. Symptoms are consistent with previous episodes of bronchitis. Pt also reports neck stiffness with intermittent tinging in his left arm, worse with movement of his neck. Pt denies wheezing, nausea or vomiting.    Past Medical History  Diagnosis Date  . Hypertension   . Bronchitis   . Colon polyps   . Cancer ([place])   . Prostate cancer ([place])   . High cholesterol   . Umbilical hernia   . Acid reflux    Past Surgical History  Procedure Laterality Date  . Prostate surgery    . Colonoscopy w/ biopsies and polypectomy    . Hernia repair     No family history on file. Social History  Substance Use Topics  . Smoking status: Never Smoker   . Smokeless tobacco: None  . Alcohol Use: No    Review of Systems A complete 10 system review of systems was obtained and all systems are negative except as noted in the HPI and PMH.     Allergies  Iodine and Ivp dye  Home Medications   Prior to Admission medications   Medication Sig Start Date End Date Taking? Authorizing Provider  amLODipine (NORVASC) 10 MG tablet Take 1 tablet (10 mg total) by mouth daily. 07/13/12   Larene Pickett, PA-C  chlorpheniramine-HYDROcodone (TUSSIONEX PENNKINETIC ER) 10-8 MG/5ML SUER Take 5 mLs by mouth every 12 (twelve) hours as needed for cough. 02/16/15   Jamie-Lee Galdamez, MD  fluticasone  (FLONASE) 50 MCG/ACT nasal spray Place 2 sprays into the nose daily. 07/13/12   Larene Pickett, PA-C  ranitidine (ZANTAC) 150 MG tablet Take 150 mg by mouth 2 (two) times daily.    Historical Provider, MD   BP 134/82 mmHg  Pulse 54  Temp(Src) 98 F (36.7 C) (Oral)  Resp 21  Ht 5\' 11"  (1.803 m)  Wt 208 lb (94.348 kg)  BMI 29.02 kg/m2  SpO2 97%   Physical Exam General: Well-developed, well-nourished male in no acute distress; appearance consistent with age of record HENT: normocephalic; atraumatic Eyes: pupils equal, round and reactive to light; extraocular muscles intact Neck: supple; left-sided soft tissue tenderness; tingling reproduced with movement of the neck Heart: regular rate and rhythm; no murmurs, rubs or gallops Lungs: clear to auscultation bilaterally Abdomen: soft; nondistended; nontender; no masses or hepatosplenomegaly; bowel sounds present Extremities: No deformity; full range of motion; pulses normal Neurologic: Awake, alert and oriented; motor function intact in all extremities and symmetric; no facial droop Skin: Warm and dry Psychiatric: Normal mood and affect  ED Course  Procedures (including critical care time) DIAGNOSTIC STUDIES: Oxygen Saturation is 97% on RA, normal by my interpretation.    COORDINATION OF CARE: 11:52 PM-Discussed treatment plan with pt at bedside and pt agreed to plan.    EKG Interpretation   Date/Time:  Wednesday February 16 2015 22:42:22 EDT Ventricular Rate:  52 PR Interval:  144 QRS Duration: 90 QT Interval:  430 QTC Calculation: 399 R Axis:   11 Text Interpretation:  Sinus bradycardia Otherwise normal ECG Unchanged  Confirmed by Florina Ou  MD, Jenny Reichmann ([id number]) on 02/16/2015 11:43:02 PM      MDM   Nursing notes and vitals signs, including pulse oximetry, reviewed.  Summary of this visit's results, reviewed by myself:  Labs:  Results for orders placed or performed during the hospital encounter of 02/16/15 (from the past 24  hour(s))  Basic metabolic panel     Status: Abnormal   Collection Time: 02/16/15 10:50 PM  Result Value Ref Range   Sodium 139 135 - 145 mmol/L   Potassium 4.2 3.5 - 5.1 mmol/L   Chloride 110 101 - 111 mmol/L   CO2 26 22 - 32 mmol/L   Glucose, Bld 90 65 - 99 mg/dL   BUN 19 6 - 20 mg/dL   Creatinine, Ser 1.35 (H) 0.61 - 1.24 mg/dL   Calcium 8.9 8.9 - 10.3 mg/dL   GFR calc non Af Amer 58 (L) >60 mL/min   GFR calc Af Amer >60 >60 mL/min   Anion gap 3 (L) 5 - 15  CBC     Status: Abnormal   Collection Time: 02/16/15 10:50 PM  Result Value Ref Range   WBC 7.1 4.0 - 10.5 K/uL   RBC 5.44 4.22 - 5.81 MIL/uL   Hemoglobin 14.6 13.0 - 17.0 g/dL   HCT 41.5 39.0 - 52.0 %   MCV 76.3 (L) 78.0 - 100.0 fL   MCH 26.8 26.0 - 34.0 pg   MCHC 35.2 30.0 - 36.0 g/dL   RDW 12.6 11.5 - 15.5 %   Platelets 257 150 - 400 K/uL  Troponin I     Status: None   Collection Time: 02/16/15 10:50 PM  Result Value Ref Range   Troponin I <0.03 <0.031 ng/mL    Imaging Studies: Dg Chest 2 View  02/16/2015  CLINICAL DATA:  54 year old male with intermittent midline chest pain for the past 2 days with cough and cold like symptoms. Left-sided neck stiffness and left arm tingling for 1 week. EXAM: CHEST  2 VIEW COMPARISON:  Chest x-ray 10/14/2014. FINDINGS: Lung volumes are normal. No consolidative airspace disease. No pleural effusions. No pneumothorax. No pulmonary nodule or mass noted. Pulmonary vasculature and the cardiomediastinal silhouette are within normal limits. IMPRESSION: No radiographic evidence of acute cardiopulmonary disease. Electronically Signed   By: Vinnie Langton M.D.   On: 02/16/2015 23:18     Final diagnoses:  Bronchitis  Cervical radiculopathy   I personally performed the services described in this documentation, which was scribed in my presence. The recorded information has been reviewed and is accurate.      Shanon Rosser, MD 02/16/15 [phone]

## 2015-02-16 NOTE — ED Notes (Signed)
Intermittent midline chest pain since Monday with cough and cold symptoms.  Pt also c/o left side neck stiffness with left arm tingling since last week.

## 2015-02-17 NOTE — ED Notes (Signed)
Pt verbalizes understanding of d/c instructions and denies any further needs at this time. 

## 2015-07-26 ENCOUNTER — Emergency Department (HOSPITAL_BASED_OUTPATIENT_CLINIC_OR_DEPARTMENT_OTHER)
Admission: EM | Admit: 2015-07-26 | Discharge: 2015-07-26 | Disposition: A | Discharge status: Home / Self Care | Payer: 59 | Attending: Emergency Medicine | Admitting: Emergency Medicine

## 2015-07-26 ENCOUNTER — Encounter (HOSPITAL_BASED_OUTPATIENT_CLINIC_OR_DEPARTMENT_OTHER): Payer: Self-pay | Admitting: Emergency Medicine

## 2015-07-26 ENCOUNTER — Emergency Department (HOSPITAL_BASED_OUTPATIENT_CLINIC_OR_DEPARTMENT_OTHER): Payer: 59

## 2015-07-26 DIAGNOSIS — Z8639 Personal history of other endocrine, nutritional and metabolic disease: Secondary | ICD-10-CM | POA: Insufficient documentation

## 2015-07-26 DIAGNOSIS — K219 Gastro-esophageal reflux disease without esophagitis: Secondary | ICD-10-CM | POA: Diagnosis not present

## 2015-07-26 DIAGNOSIS — Z86010 Personal history of colonic polyps: Secondary | ICD-10-CM | POA: Insufficient documentation

## 2015-07-26 DIAGNOSIS — R059 Cough, unspecified: Secondary | ICD-10-CM

## 2015-07-26 DIAGNOSIS — R69 Illness, unspecified: Secondary | ICD-10-CM

## 2015-07-26 DIAGNOSIS — R0789 Other chest pain: Secondary | ICD-10-CM | POA: Diagnosis not present

## 2015-07-26 DIAGNOSIS — Z7951 Long term (current) use of inhaled steroids: Secondary | ICD-10-CM | POA: Insufficient documentation

## 2015-07-26 DIAGNOSIS — Z8546 Personal history of malignant neoplasm of prostate: Secondary | ICD-10-CM | POA: Diagnosis not present

## 2015-07-26 DIAGNOSIS — M545 Low back pain: Secondary | ICD-10-CM | POA: Insufficient documentation

## 2015-07-26 DIAGNOSIS — Z79899 Other long term (current) drug therapy: Secondary | ICD-10-CM | POA: Insufficient documentation

## 2015-07-26 DIAGNOSIS — R05 Cough: Secondary | ICD-10-CM

## 2015-07-26 DIAGNOSIS — J111 Influenza due to unidentified influenza virus with other respiratory manifestations: Secondary | ICD-10-CM | POA: Diagnosis not present

## 2015-07-26 DIAGNOSIS — R300 Dysuria: Secondary | ICD-10-CM | POA: Diagnosis not present

## 2015-07-26 DIAGNOSIS — I10 Essential (primary) hypertension: Secondary | ICD-10-CM | POA: Insufficient documentation

## 2015-07-26 DIAGNOSIS — R109 Unspecified abdominal pain: Secondary | ICD-10-CM | POA: Diagnosis not present

## 2015-07-26 DIAGNOSIS — R319 Hematuria, unspecified: Secondary | ICD-10-CM | POA: Diagnosis not present

## 2015-07-26 LAB — URINALYSIS, ROUTINE W REFLEX MICROSCOPIC
Bilirubin Urine: NEGATIVE
GLUCOSE, UA: NEGATIVE mg/dL
Ketones, ur: 15 mg/dL — AB
Nitrite: NEGATIVE
Protein, ur: NEGATIVE mg/dL
SPECIFIC GRAVITY, URINE: 1.029 (ref 1.005–1.030)
pH: 6 (ref 5.0–8.0)

## 2015-07-26 LAB — URINE MICROSCOPIC-ADD ON

## 2015-07-26 MED ORDER — ALBUTEROL SULFATE HFA 108 (90 BASE) MCG/ACT IN AERS
4.0000 | INHALATION_SPRAY | Freq: Once | RESPIRATORY_TRACT | Status: AC
  Administered 2015-07-26: 4 via RESPIRATORY_TRACT
  Filled 2015-07-26: qty 6.7

## 2015-07-26 MED ORDER — AEROCHAMBER PLUS FLO-VU LARGE MISC
1.0000 | Freq: Once | Status: DC
  Filled 2015-07-26: qty 1

## 2015-07-26 MED ORDER — PREDNISONE 10 MG PO TABS: 50.0000 mg | ORAL_TABLET | Freq: Every day | ORAL | Status: DC

## 2015-07-26 MED ORDER — CEPHALEXIN 500 MG PO CAPS: 500.0000 mg | ORAL_CAPSULE | Freq: Two times a day (BID) | ORAL | Status: DC

## 2015-07-26 MED ORDER — PREDNISONE 10 MG PO TABS
60.0000 mg | ORAL_TABLET | Freq: Once | ORAL | Status: AC
  Administered 2015-07-26: 60 mg via ORAL
  Filled 2015-07-26: qty 1

## 2015-07-26 NOTE — ED Notes (Signed)
Patient states that he has had generalized body aches and cough since Friday. The patient reports that the is also having a lot blood in his urine and would like to be checked for a UTI

## 2015-07-26 NOTE — Discharge Instructions (Signed)
You likely have a flulike illness. Continue drinking plenty of fluids and taking Tylenol as needed for fever and pain. Return without fail for worsening symptoms including difficulty breathing, worsening pain. Use her inhaler as needed for your bronchospastic cough with take steroids as prescribed. Urine was not convincing for urinary tract infection, however it is sent for culture. I you are written for a course of antibiotics given that he stated this is consistent with your prior urinary tract infections. Please follow-up with urologist as well as your primary care provider for reevaluation.  Cough, Adult A cough helps to clear your throat and lungs. A cough may last only 2-3 weeks (acute), or it may last longer than 8 weeks (chronic). Many different things can cause a cough. A cough may be a sign of an illness or another medical condition. HOME CARE  Pay attention to any changes in your cough.  Take medicines only as told by your doctor.  If you were prescribed an antibiotic medicine, take it as told by your doctor. Do not stop taking it even if you start to feel better.  Talk with your doctor before you try using a cough medicine.  Drink enough fluid to keep your pee (urine) clear or pale yellow.  If the air is dry, use a cold steam vaporizer or humidifier in your home.  Stay away from things that make you cough at work or at home.  If your cough is worse at night, try using extra pillows to raise your head up higher while you sleep.  Do not smoke, and try not to be around smoke. If you need help quitting, ask your doctor.  Do not have caffeine.  Do not drink alcohol.  Rest as needed. GET HELP IF:  You have new problems (symptoms).  You cough up yellow fluid (pus).  Your cough does not get better after 2-3 weeks, or your cough gets worse.  Medicine does not help your cough and you are not sleeping well.  You have pain that gets worse or pain that is not helped with  medicine.  You have a fever.  You are losing weight and you do not know why.  You have night sweats. GET HELP RIGHT AWAY IF:  You cough up blood.  You have trouble breathing.  Your heartbeat is very fast.   This information is not intended to replace advice given to you by your health care provider. Make sure you discuss any questions you have with your health care provider.   Document Released: 12/21/2010 Document Revised: 12/29/2014 Document Reviewed: 06/16/2014 Elsevier Interactive Patient Education 2016 Else[employer]  Influenza, Adult Influenza (flu) is an infection in the mouth, nose, and throat (respiratory tract) caused by a virus. The flu can make you feel very ill. Influenza spreads easily from person to person (contagious).  HOME CARE   Only take medicines as told by your doctor.  Use a cool mist humidifier to make breathing easier.  Get plenty of rest until your fever goes away. This usually takes 3 to 4 days.  Drink enough fluids to keep your pee (urine) clear or pale yellow.  Cover your mouth and nose when you cough or sneeze.  Wash your hands well to avoid spreading the flu.  Stay home from work or school until your fever has been gone for at least 1 full day.  Get a flu shot every year. GET HELP RIGHT AWAY IF:   You have trouble breathing or feel short  of breath.  Your skin or nails turn blue.  You have severe neck pain or stiffness.  You have a severe headache, facial pain, or earache.  Your fever gets worse or keeps coming back.  You feel sick to your stomach (nauseous), throw up (vomit), or have watery poop (diarrhea).  You have chest pain.  You have a deep cough that gets worse, or you cough up more thick spit (mucus). MAKE SURE YOU:   Understand these instructions.  Will watch your condition.  Will get help right away if you are not doing well or get worse.   This information is not intended to replace advice given to you by your  health care provider. Make sure you discuss any questions you have with your health care provider.   Document Released: 01/17/2008 Document Revised: 04/30/2014 Document Reviewed: 07/09/2011 Elsevier Interactive Patient Education [employer].

## 2015-07-26 NOTE — ED Provider Notes (Signed)
CSN: [id number]     Arrival date & time 07/26/15  1908 History  By signing my name below, I, Rowan Blase, attest that this documentation has been prepared under the direction and in the presence of Forde Dandy, MD . Electronically Signed: Rowan Blase, Scribe. 07/26/2015. 7:42 PM.   Chief Complaint  Patient presents with  . Generalized Body Aches   The history is provided by the patient. No language interpreter was used.   HPI Comments:  Cole Chung is a 55 y.o. male with PMHx HTN, HLD, bronchitis and prostate cancer who presents to the Emergency Department complaining of generalized body aches for the past four days. Pt reports associated cough, congestion, chest tightness secondary to cough, rhinorrhea, and fever tmax 102. He also reports hematuria, and dysuria for the past four days. Pt notes he has chronic blood in his urine due to prostate surgery, but it is currently worse than his baseline; he notes his current symptoms feel like a past UTI. No alleviating factors noted. He reports his wife has been sick recently. Pt denies nausea, vomiting, diarrhea, or abdominal pain.  Past Medical History  Diagnosis Date  . Hypertension   . Bronchitis   . Colon polyps   . Cancer ([place])   . Prostate cancer ([place])   . High cholesterol   . Umbilical hernia   . Acid reflux    Past Surgical History  Procedure Laterality Date  . Prostate surgery    . Colonoscopy w/ biopsies and polypectomy    . Hernia repair     History reviewed. No pertinent family history. Social History  Substance Use Topics  . Smoking status: Never Smoker   . Smokeless tobacco: None  . Alcohol Use: No    Review of Systems  Constitutional: Positive for fever.  HENT: Positive for rhinorrhea.   Respiratory: Positive for cough and chest tightness.   Gastrointestinal: Negative for nausea, vomiting, abdominal pain and diarrhea.  Genitourinary: Positive for dysuria, hematuria and flank pain.  Musculoskeletal: Positive  for myalgias (generalized).  All other systems reviewed and are negative.  Allergies  Iodine and Ivp dye  Home Medications   Prior to Admission medications   Medication Sig Start Date End Date Taking? Authorizing Provider  amLODipine (NORVASC) 10 MG tablet Take 1 tablet (10 mg total) by mouth daily. 07/13/12   Larene Pickett, PA-C  chlorpheniramine-HYDROcodone (TUSSIONEX PENNKINETIC ER) 10-8 MG/5ML SUER Take 5 mLs by mouth every 12 (twelve) hours as needed for cough. 02/16/15   John Molpus, MD  fluticasone (FLONASE) 50 MCG/ACT nasal spray Place 2 sprays into the nose daily. 07/13/12   Larene Pickett, PA-C  predniSONE (DELTASONE) 10 MG tablet Take 5 tablets (50 mg total) by mouth daily. 07/26/15   Forde Dandy, MD  ranitidine (ZANTAC) 150 MG tablet Take 150 mg by mouth 2 (two) times daily.    Historical Provider, MD   BP 130/83 mmHg  Pulse 64  Temp(Src) 98.7 F (37.1 C) (Oral)  Resp 20  SpO2 97% Physical Exam Physical Exam  Nursing note and vitals reviewed. Constitutional: Well developed, well nourished, non-toxic, and in no acute distress Head: Normocephalic and atraumatic.  Mouth/Throat: Posterior oropharynx mildly erythematous; no exudates or swelling. Moist mucous membranes.  Neck: Normal range of motion. Neck supple.  Cardiovascular: Normal rate and regular rhythm.   Pulmonary/Chest: Effort normal and breath sounds normal. Bronchospastic cough with deep inspiration. Abdominal: Soft. There is no tenderness. There is no rebound and no guarding. Low  back pain of low paraspinal muscles, but no CVA tenderness. Musculoskeletal: Normal range of motion.  Neurological: Alert, no facial droop, fluent speech, moves all extremities symmetrically Skin: Skin is warm and dry.  Psychiatric: Cooperative  ED Course  Procedures  DIAGNOSTIC STUDIES:  Oxygen Saturation is 99% on RA, normal by my interpretation.    COORDINATION OF CARE:  7:37 PM Will order chest x-ray and UA. Will administer  breathing treatment and prednisone. Discussed treatment plan with pt at bedside and pt agreed to plan.  Labs Review Labs Reviewed  URINALYSIS, ROUTINE W REFLEX MICROSCOPIC (NOT AT [facility]) - Abnormal; Notable for the following:    Color, Urine AMBER (*)    Hgb urine dipstick LARGE (*)    Ketones, ur 15 (*)    Leukocytes, UA TRACE (*)    All other components within normal limits  URINE MICROSCOPIC-ADD ON - Abnormal; Notable for the following:    Squamous Epithelial / LPF 0-5 (*)    Bacteria, UA RARE (*)    All other components within normal limits  URINE CULTURE    Imaging Review Dg Chest 2 View  07/26/2015  CLINICAL DATA:  Cough, congestion and body aches for 1 week. Initial encounter. EXAM: CHEST  2 VIEW COMPARISON:  PA and lateral chest 02/16/2015 and 10/14/2014. FINDINGS: The lungs are clear. Heart size is normal. No pneumothorax or pleural effusion. No bony abnormality. IMPRESSION: Normal chest. Electronically Signed   By: Inge Rise M.D.   On: 07/26/2015 21:25   I have personally reviewed and evaluated these images and lab results as part of my medical decision-making.   EKG Interpretation   Date/Time:  Tuesday July 26 2015 19:49:24 EDT Ventricular Rate:  61 PR Interval:  137 QRS Duration: 93 QT Interval:  412 QTC Calculation: 415 R Axis:   43 Text Interpretation:  Sinus rhythm No acute changes Confirmed by LIU MD,  Hinton Dyer [id number]) on 07/26/2015 8:00:51 PM      MDM   Final diagnoses:  Influenza-like illness  Cough    55 year old male who presents with 4 days of cough, congestion, runny nose and fever. He is afebrile on arrival, with stable vital signs. Symptoms seems suggestive of flulike illness. He is out of the window for Tamiflu. With bronchospastic cough on exam, improved with albuterol treatment. Given steroids given history of chronic bronchitis per patient. Chest x-ray without signs for pneumonia. Also having some urinary symptoms, and UA with blood which she  chronically has. Small leukocytes and a few WBCs, not convincing for UTI but given that this is his also similar to prior UTI he will be empirically treated with a course of Keflex. A urine is sent for culture. No CVA tenderness or abdominal pain, and less likely symptoms from pyelonephritis. Discuss continued supportive care for home. Strict return and follow-up instructions are reviewed. He expressed understanding of all discharge instructions and felt comfortable to plan of care.  I personally performed the services described in this documentation, which was scribed in my presence. The recorded information has been reviewed and is accurate.   Forde Dandy, MD 07/26/15 2216

## 2015-07-28 LAB — URINE CULTURE: CULTURE: NO GROWTH

## 2017-01-27 ENCOUNTER — Encounter (HOSPITAL_BASED_OUTPATIENT_CLINIC_OR_DEPARTMENT_OTHER): Payer: Self-pay

## 2017-01-27 ENCOUNTER — Emergency Department (HOSPITAL_BASED_OUTPATIENT_CLINIC_OR_DEPARTMENT_OTHER)
Admission: EM | Admit: 2017-01-27 | Discharge: 2017-01-27 | Disposition: A | Discharge status: Home / Self Care | Payer: 59 | Attending: Emergency Medicine | Admitting: Emergency Medicine

## 2017-01-27 ENCOUNTER — Emergency Department (HOSPITAL_BASED_OUTPATIENT_CLINIC_OR_DEPARTMENT_OTHER): Payer: 59

## 2017-01-27 DIAGNOSIS — I10 Essential (primary) hypertension: Secondary | ICD-10-CM | POA: Insufficient documentation

## 2017-01-27 DIAGNOSIS — M5030 Other cervical disc degeneration, unspecified cervical region: Secondary | ICD-10-CM | POA: Insufficient documentation

## 2017-01-27 DIAGNOSIS — Z8546 Personal history of malignant neoplasm of prostate: Secondary | ICD-10-CM | POA: Insufficient documentation

## 2017-01-27 DIAGNOSIS — M792 Neuralgia and neuritis, unspecified: Secondary | ICD-10-CM | POA: Insufficient documentation

## 2017-01-27 DIAGNOSIS — Z79899 Other long term (current) drug therapy: Secondary | ICD-10-CM | POA: Diagnosis not present

## 2017-01-27 DIAGNOSIS — R001 Bradycardia, unspecified: Secondary | ICD-10-CM

## 2017-01-27 DIAGNOSIS — R202 Paresthesia of skin: Secondary | ICD-10-CM | POA: Diagnosis present

## 2017-01-27 LAB — CBC WITH DIFFERENTIAL/PLATELET
Basophils Absolute: 0 10*3/uL (ref 0.0–0.1)
Basophils Relative: 0 %
Eosinophils Absolute: 0.2 10*3/uL (ref 0.0–0.7)
Eosinophils Relative: 4 %
HCT: 40.7 % (ref 39.0–52.0)
HEMOGLOBIN: 14.6 g/dL (ref 13.0–17.0)
LYMPHS PCT: 54 %
Lymphs Abs: 2.6 10*3/uL (ref 0.7–4.0)
MCH: 27.3 pg (ref 26.0–34.0)
MCHC: 35.9 g/dL (ref 30.0–36.0)
MCV: 76.1 fL — ABNORMAL LOW (ref 78.0–100.0)
Monocytes Absolute: 0.5 10*3/uL (ref 0.1–1.0)
Monocytes Relative: 9 %
NEUTROS ABS: 1.6 10*3/uL — AB (ref 1.7–7.7)
Neutrophils Relative %: 33 %
Platelets: 220 10*3/uL (ref 150–400)
RBC: 5.35 MIL/uL (ref 4.22–5.81)
RDW: 12.6 % (ref 11.5–15.5)
WBC: 4.9 10*3/uL (ref 4.0–10.5)

## 2017-01-27 LAB — BASIC METABOLIC PANEL
Anion gap: 7 (ref 5–15)
BUN: 13 mg/dL (ref 6–20)
CHLORIDE: 107 mmol/L (ref 101–111)
CO2: 22 mmol/L (ref 22–32)
CREATININE: 1.22 mg/dL (ref 0.61–1.24)
Calcium: 8.7 mg/dL — ABNORMAL LOW (ref 8.9–10.3)
GFR calc Af Amer: >60 mL/min (ref >60)
GFR calc non Af Amer: >60 mL/min (ref >60)
Glucose, Bld: 111 mg/dL — ABNORMAL HIGH (ref 65–99)
Potassium: 3.8 mmol/L (ref 3.5–5.1)
Sodium: 136 mmol/L (ref 135–145)

## 2017-01-27 LAB — TROPONIN I: Troponin I: <0.03 ng/mL (ref <0.03)

## 2017-01-27 MED ORDER — PREDNISONE 20 MG PO TABS: ORAL_TABLET | ORAL | 0 refills | Status: DC

## 2017-01-27 MED ORDER — IBUPROFEN 400 MG PO TABS
400.0000 mg | ORAL_TABLET | Freq: Once | ORAL | Status: AC
  Administered 2017-01-27: 400 mg via ORAL
  Filled 2017-01-27: qty 1

## 2017-01-27 MED ORDER — TRAMADOL HCL 50 MG PO TABS: 50.0000 mg | ORAL_TABLET | Freq: Four times a day (QID) | ORAL | 0 refills | Status: AC | PRN

## 2017-01-27 NOTE — Discharge Instructions (Signed)
It was our pleasure to provide your ER care today - we hope that you feel better.  Your neck ct was read as follows: IMPRESSION: 1. 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  For your neck/disc issue - take prednisone as prescribed, take ultram as need for pain (no driving when taking ultram), and follow up with neurosurgery in the next 1-2 weeks - see referral - call office to arrange appointment.    For blood pressure control and slow heart rate, follow up with cardiologist in the next couple weeks - see referral - call office for appointment.  Return to ER if worse, intractable pain, extremity weakness, weak/fainting, other concern.

## 2017-01-27 NOTE — ED Triage Notes (Addendum)
Pt reports left arm tingling x4 days, associated intermittent left neck pain for 7 months, that has worsened over the past 4 days and exacerbated by coughing. Associated left shoulder pain. Denies injury. Alert, oriented, no distress, ambulatory with steady gait, grips strong and equal bilaterally.

## 2017-01-27 NOTE — ED Provider Notes (Signed)
[facility] DEPT [facility] Provider Note   CSN: [id number] Arrival date & time: 01/27/17  1453     History   Chief Complaint Chief Complaint  Patient presents with  . Left Arm Tingling    HPI Rodderick Holtzer is a 56 y.o. male.  Patient c/o left neck pain for past year, constant, dull, worse w certain movements, radiating towards and down left arm.  In past few days, occasional numb/tingly sensation to left arm associated with the pain. Denies injury or trauma to neck. No chest pain or discomfort. No sob. No hx ddd. No wt loss. No fever or chills.    The history is provided by the patient.    Past Medical History:  Diagnosis Date  . Acid reflux   . Bronchitis   . Cancer ([place])   . Colon polyps   . High cholesterol   . Hypertension   . Prostate cancer ([place])   . Umbilical hernia     There are no active problems to display for this patient.   Past Surgical History:  Procedure Laterality Date  . COLONOSCOPY W/ BIOPSIES AND POLYPECTOMY    . HERNIA REPAIR    . PROSTATE SURGERY    . TONSILLECTOMY         Home Medications    Prior to Admission medications   Medication Sig Start Date End Date Taking? Authorizing Provider  amLODipine (NORVASC) 10 MG tablet Take 1 tablet (10 mg total) by mouth daily. 07/13/12  Yes Larene Pickett, PA-C  cephALEXin (KEFLEX) 500 MG capsule Take 1 capsule (500 mg total) by mouth 2 (two) times daily. 07/26/15   Forde Dandy, MD  chlorpheniramine-HYDROcodone [facility]) 10-8 MG/5ML SUER Take 5 mLs by mouth every 12 (twelve) hours as needed for cough. 02/16/15   Molpus, John, MD  fluticasone (FLONASE) 50 MCG/ACT nasal spray Place 2 sprays into the nose daily. 07/13/12   Larene Pickett, PA-C  predniSONE (DELTASONE) 10 MG tablet Take 5 tablets (50 mg total) by mouth daily. 07/26/15   Forde Dandy, MD  ranitidine (ZANTAC) 150 MG tablet Take 150 mg by mouth 2 (two) times daily.    [provider]    Family History History  reviewed. No pertinent family history.  Social History Social History  Substance Use Topics  . Smoking status: Never Smoker  . Smokeless tobacco: Never Used  . Alcohol use No     Allergies   Iodine and Ivp dye [iodinated diagnostic agents]   Review of Systems Review of Systems  Constitutional: Negative for fever.  HENT: Negative for sore throat.   Eyes: Negative for redness.  Respiratory: Negative for shortness of breath.   Cardiovascular: Negative for chest pain.  Gastrointestinal: Negative for abdominal pain.  Genitourinary: Negative for flank pain.  Musculoskeletal: Positive for neck pain.  Skin: Negative for rash.  Neurological: Negative for weakness and headaches.  Hematological: Does not bruise/bleed easily.  Psychiatric/Behavioral: Negative for confusion.     Physical Exam Updated Vital Signs BP (!) 163/86   Pulse (!) 45   Temp 98.1 F (36.7 C) (Oral)   Resp 16   Ht 1.803 m (5\' 11" )   Wt 94.3 kg (208 lb)   SpO2 100%   BMI 29.01 kg/m   Physical Exam  Constitutional: He appears well-developed and well-nourished. No distress.  HENT:  Head: Atraumatic.  Eyes: Conjunctivae are normal.  Neck: Normal range of motion. Neck supple. No tracheal deviation present. No thyromegaly present.  Cardiovascular:  Intact distal pulses.   Pulmonary/Chest: Effort normal. No accessory muscle usage. No respiratory distress.  Abdominal: He exhibits no distension.  Musculoskeletal:  Mid to lower cervical tenderness and left trapezius muscular tenderness. No sts noted. Good rom left shoulder without pain. Radial pulse 2+.  Neurological: He is alert.  LUE rad/uln/med n fxn, motor/sens intact. stre 5/5.   Skin: Skin is warm and dry. No rash noted. He is not diaphoretic.  Psychiatric: He has a normal mood and affect.  Nursing note and vitals reviewed.    ED Treatments / Results  Labs (all labs ordered are listed, but only abnormal results are displayed) Results for orders  placed or performed during the hospital encounter of 01/27/17  CBC with Differential  Result Value Ref Range   WBC 4.9 4.0 - 10.5 K/uL   RBC 5.35 4.22 - 5.81 MIL/uL   Hemoglobin 14.6 13.0 - 17.0 g/dL   HCT 40.7 39.0 - 52.0 %   MCV 76.1 (L) 78.0 - 100.0 fL   MCH 27.3 26.0 - 34.0 pg   MCHC 35.9 30.0 - 36.0 g/dL   RDW 12.6 11.5 - 15.5 %   Platelets 220 150 - 400 K/uL   Neutrophils Relative % 33 %   Neutro Abs 1.6 (L) 1.7 - 7.7 K/uL   Lymphocytes Relative 54 %   Lymphs Abs 2.6 0.7 - 4.0 K/uL   Monocytes Relative 9 %   Monocytes Absolute 0.5 0.1 - 1.0 K/uL   Eosinophils Relative 4 %   Eosinophils Absolute 0.2 0.0 - 0.7 K/uL   Basophils Relative 0 %   Basophils Absolute 0.0 0.0 - 0.1 K/uL  Basic metabolic panel  Result Value Ref Range   Sodium 136 135 - 145 mmol/L   Potassium 3.8 3.5 - 5.1 mmol/L   Chloride 107 101 - 111 mmol/L   CO2 22 22 - 32 mmol/L   Glucose, Bld 111 (H) 65 - 99 mg/dL   BUN 13 6 - 20 mg/dL   Creatinine, Ser 1.22 0.61 - 1.24 mg/dL   Calcium 8.7 (L) 8.9 - 10.3 mg/dL   GFR calc non Af Amer >60 >60 mL/min   GFR calc Af Amer >60 >60 mL/min   Anion gap 7 5 - 15  Troponin I  Result Value Ref Range   Troponin I <0.03 <0.03 ng/mL   Ct Cervical Spine Wo Contrast  Result Date: 01/27/2017 CLINICAL DATA:  Neck pain with left arm and hand tingling. No known injury. EXAM: CT CERVICAL SPINE WITHOUT CONTRAST TECHNIQUE: Multidetector CT imaging of the cervical spine was performed without intravenous contrast. Multiplanar CT image reconstructions were also generated. COMPARISON:  None. FINDINGS: Alignment: Straightening of the normal cervical lordosis. Skull base and vertebrae: No acute fracture. No primary bone lesion or focal pathologic process. Soft tissues and spinal canal: No prevertebral fluid or swelling. No visible canal hematoma. Disc levels: C2-C3:  Negative. C3-C4: Posterior disc osteophyte complex and moderate bilateral uncovertebral hypertrophy resulting in mild  spinal canal stenosis and mild bilateral neuroforaminal stenosis. C4-C5: Disc bulge, mild bilateral uncovertebral hypertrophy, and moderate left facet arthropathy resulting in mild bilateral neuroforaminal stenosis. C5-C6: Disc height loss and moderate left greater than right uncovertebral hypertrophy resulting in severe left neuroforaminal stenosis. C6-C7: Disc height loss and mild bilateral uncovertebral hypertrophy resulting in mild left neuroforaminal stenosis. C7-T1:  Negative. Upper chest: Negative. Other: None. IMPRESSION: 1. 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 2.  No  acute osseous abnormality. Electronically Signed   By: Titus Dubin M.D.   On: 01/27/2017 17:03    EKG  EKG Interpretation  Date/Time:  Sunday January 27 2017 15:08:11 EDT Ventricular Rate:  58 PR Interval:  146 QRS Duration: 86 QT Interval:  430 QTC Calculation: 422 R Axis:   10 Text Interpretation:  Sinus bradycardia Otherwise normal ECG Confirmed by Lajean Saver [phone]) on 01/27/2017 3:58:14 PM       Radiology No results found.  Procedures Procedures (including critical care time)  Medications Ordered in ED Medications  ibuprofen (ADVIL,MOTRIN) tablet 400 mg (not administered)     Initial Impression / Assessment and Plan / ED Course  I have reviewed the triage vital signs and the nursing notes.  Pertinent labs & imaging results that were available during my care of the patient were reviewed by me and considered in my medical decision making (see chart for details).  Will get CT.  Motrin po.  Reviewed nursing notes and prior charts for additional history.   rx prednisone and ultram for home.   rec neurosurgery f/u as outpt.   Also pcp f/u re bp.   Final Clinical Impressions(s) / ED Diagnoses   Final diagnoses:  None    New Prescriptions New Prescriptions   No medications on file     Lajean Saver,  MD 01/27/17 1731

## 2017-02-18 ENCOUNTER — Encounter (HOSPITAL_BASED_OUTPATIENT_CLINIC_OR_DEPARTMENT_OTHER): Payer: Self-pay | Admitting: *Deleted

## 2017-02-18 ENCOUNTER — Emergency Department (HOSPITAL_BASED_OUTPATIENT_CLINIC_OR_DEPARTMENT_OTHER)
Admission: EM | Admit: 2017-02-18 | Discharge: 2017-02-19 | Disposition: A | Discharge status: Home / Self Care | Payer: 59 | Attending: Emergency Medicine | Admitting: Emergency Medicine

## 2017-02-18 ENCOUNTER — Emergency Department (HOSPITAL_BASED_OUTPATIENT_CLINIC_OR_DEPARTMENT_OTHER): Payer: 59

## 2017-02-18 DIAGNOSIS — Z79899 Other long term (current) drug therapy: Secondary | ICD-10-CM | POA: Insufficient documentation

## 2017-02-18 DIAGNOSIS — I10 Essential (primary) hypertension: Secondary | ICD-10-CM | POA: Diagnosis not present

## 2017-02-18 DIAGNOSIS — R319 Hematuria, unspecified: Secondary | ICD-10-CM | POA: Diagnosis not present

## 2017-02-18 DIAGNOSIS — R109 Unspecified abdominal pain: Secondary | ICD-10-CM

## 2017-02-18 DIAGNOSIS — Z8546 Personal history of malignant neoplasm of prostate: Secondary | ICD-10-CM | POA: Diagnosis not present

## 2017-02-18 LAB — CBC
HEMATOCRIT: 43 % (ref 39.0–52.0)
Hemoglobin: 15.1 g/dL (ref 13.0–17.0)
MCH: 27.1 pg (ref 26.0–34.0)
MCHC: 35.1 g/dL (ref 30.0–36.0)
MCV: 77.1 fL — AB (ref 78.0–100.0)
Platelets: 224 10*3/uL (ref 150–400)
RBC: 5.58 MIL/uL (ref 4.22–5.81)
RDW: 12.9 % (ref 11.5–15.5)
WBC: 6.5 10*3/uL (ref 4.0–10.5)

## 2017-02-18 LAB — COMPREHENSIVE METABOLIC PANEL
ALBUMIN: 4 g/dL (ref 3.5–5.0)
ALT: 47 U/L (ref 17–63)
AST: 27 U/L (ref 15–41)
Alkaline Phosphatase: 85 U/L (ref 38–126)
Anion gap: 7 (ref 5–15)
BILIRUBIN TOTAL: 1 mg/dL (ref 0.3–1.2)
BUN: 16 mg/dL (ref 6–20)
CHLORIDE: 104 mmol/L (ref 101–111)
CO2: 26 mmol/L (ref 22–32)
CREATININE: 1.14 mg/dL (ref 0.61–1.24)
Calcium: 9.3 mg/dL (ref 8.9–10.3)
GFR calc Af Amer: >60 mL/min (ref >60)
GFR calc non Af Amer: >60 mL/min (ref >60)
GLUCOSE: 96 mg/dL (ref 65–99)
POTASSIUM: 4.8 mmol/L (ref 3.5–5.1)
Sodium: 137 mmol/L (ref 135–145)
Total Protein: 7.8 g/dL (ref 6.5–8.1)

## 2017-02-18 LAB — URINALYSIS, ROUTINE W REFLEX MICROSCOPIC
BILIRUBIN URINE: NEGATIVE
GLUCOSE, UA: NEGATIVE mg/dL
Ketones, ur: NEGATIVE mg/dL
Leukocytes, UA: NEGATIVE
Nitrite: NEGATIVE
PH: 6 (ref 5.0–8.0)
Protein, ur: NEGATIVE mg/dL

## 2017-02-18 LAB — URINALYSIS, MICROSCOPIC (REFLEX): WBC, UA: NONE SEEN WBC/hpf (ref 0–5)

## 2017-02-18 NOTE — ED Triage Notes (Signed)
Pt c/o right flank pain  And hemturia  x 3 days, seen by PMD x 4 days ago for same, Appt  sched with urology

## 2017-02-19 MED ORDER — OXYCODONE-ACETAMINOPHEN 5-325 MG PO TABS
1.0000 | ORAL_TABLET | Freq: Once | ORAL | Status: AC
  Administered 2017-02-19: 1 via ORAL
  Filled 2017-02-19: qty 1

## 2017-02-19 MED ORDER — NAPROXEN 500 MG PO TABS: 500.0000 mg | ORAL_TABLET | Freq: Two times a day (BID) | ORAL | 0 refills | Status: AC

## 2017-02-19 MED ORDER — KETOROLAC TROMETHAMINE 30 MG/ML IJ SOLN
30.0000 mg | Freq: Once | INTRAMUSCULAR | Status: AC
  Administered 2017-02-19: 30 mg via INTRAMUSCULAR
  Filled 2017-02-19: qty 1

## 2017-02-19 MED ORDER — CYCLOBENZAPRINE HCL 5 MG PO TABS: 5.0000 mg | ORAL_TABLET | Freq: Two times a day (BID) | ORAL | 0 refills | Status: AC | PRN

## 2017-02-19 NOTE — Discharge Instructions (Signed)
Seen today for flank pain.  There is no evidence of kidney stone on your CT scan.  You do have a cyst in the left kidney.  You need a follow-up CT scan or MRI.  This could be musculoskeletal.  Additionally if you develop a rash this could be shingles.  Take naproxen and Flexeril as needed for pain.

## 2017-02-19 NOTE — ED Provider Notes (Signed)
[facility] EMERGENCY DEPARTMENT Provider Note   CSN: [id number] Arrival date & time: 02/18/17  1957     History   Chief Complaint Chief Complaint  Patient presents with  . Flank Pain    HPI Cole Chung is a 56 y.o. male.  HPI  This is a 56 year old male with a history of prostate cancer who presents with right flank pain.  Patient reports 3-4-day history of worsening right flank pain.  His pain is worse with certain movements.  He has not taken anything for his pain.  He did see his urologist and general follow-up several days ago.  No known history of kidney stones.  Denies any dysuria but does report recent history of hematuria.  Currently he rates his pain at 10 out of 10.  He denies any weakness, numbness, tingling of the lower extremities, bowel or bladder difficulty.  Denies fevers.  Past Medical History:  Diagnosis Date  . Acid reflux   . Bronchitis   . Cancer ([place])   . Colon polyps   . High cholesterol   . Hypertension   . Prostate cancer ([place])   . Umbilical hernia     There are no active problems to display for this patient.   Past Surgical History:  Procedure Laterality Date  . COLONOSCOPY W/ BIOPSIES AND POLYPECTOMY    . HERNIA REPAIR    . PROSTATE SURGERY    . TONSILLECTOMY         Home Medications    Prior to Admission medications   Medication Sig Start Date End Date Taking? Authorizing Provider  amLODipine (NORVASC) 10 MG tablet Take 1 tablet (10 mg total) by mouth daily. 07/13/12   Larene Pickett, PA-C  chlorpheniramine-HYDROcodone (TUSSIONEX PENNKINETIC ER) 10-8 MG/5ML SUER Take 5 mLs by mouth every 12 (twelve) hours as needed for cough. 02/16/15   Molpus, John, MD  cyclobenzaprine (FLEXERIL) 5 MG tablet Take 1 tablet (5 mg total) by mouth 2 (two) times daily as needed for muscle spasms. 02/19/17   Kristjan Derner, Barbette Hair, MD  fluticasone (FLONASE) 50 MCG/ACT nasal spray Place 2 sprays into the nose daily. 07/13/12   Larene Pickett, PA-C    naproxen (NAPROSYN) 500 MG tablet Take 1 tablet (500 mg total) by mouth 2 (two) times daily. 02/19/17   Lysle Yero, Barbette Hair, MD  ranitidine (ZANTAC) 150 MG tablet Take 150 mg by mouth 2 (two) times daily.    [provider]  traMADol (ULTRAM) 50 MG tablet Take 1 tablet (50 mg total) by mouth every 6 (six) hours as needed. 01/27/17   Lajean Saver, MD    Family History History reviewed. No pertinent family history.  Social History Social History  Substance Use Topics  . Smoking status: Never Smoker  . Smokeless tobacco: Never Used  . Alcohol use No     Allergies   Iodine and Ivp dye [iodinated diagnostic agents]   Review of Systems Review of Systems  Constitutional: Negative for fever.  Respiratory: Negative for shortness of breath.   Cardiovascular: Negative for chest pain.  Gastrointestinal: Negative for abdominal pain, nausea and vomiting.  Genitourinary: Positive for flank pain and hematuria. Negative for difficulty urinating.  Neurological: Negative for weakness and numbness.  All other systems reviewed and are negative.    Physical Exam Updated Vital Signs BP (!) 130/97 (BP Location: Right Arm)   Pulse (!) 49 Comment: pt states this is baseline for him.   Temp 97.6 F (36.4 C) (Oral)  Resp 17   Ht 5\' 11"  (1.803 m)   Wt 94.8 kg (209 lb)   SpO2 97%   BMI 29.15 kg/m   Physical Exam  Constitutional: He is oriented to person, place, and time. He appears well-developed and well-nourished. No distress.  HENT:  Head: Normocephalic and atraumatic.  Cardiovascular: Normal rate, regular rhythm and normal heart sounds.   No murmur heard. Pulmonary/Chest: Effort normal and breath sounds normal. No respiratory distress. He has no wheezes.  Abdominal: Soft. Bowel sounds are normal. There is no tenderness. There is no rebound.  Musculoskeletal:  Tenderness palpation over the right flank and back, no midline tenderness to palpation, step-off, or deformity   Neurological: He is alert and oriented to person, place, and time.  Skin: Skin is warm and dry. No rash noted.  Psychiatric: He has a normal mood and affect.  Nursing note and vitals reviewed.    ED Treatments / Results  Labs (all labs ordered are listed, but only abnormal results are displayed) Labs Reviewed  URINALYSIS, ROUTINE W REFLEX MICROSCOPIC - Abnormal; Notable for the following:       Result Value   Specific Gravity, Urine >1.030 (*)    Hgb urine dipstick MODERATE (*)    All other components within normal limits  URINALYSIS, MICROSCOPIC (REFLEX) - Abnormal; Notable for the following:    Bacteria, UA RARE (*)    Squamous Epithelial / LPF 0-5 (*)    All other components within normal limits  CBC - Abnormal; Notable for the following:    MCV 77.1 (*)    All other components within normal limits  COMPREHENSIVE METABOLIC PANEL    EKG  EKG Interpretation None       Radiology Ct Renal Stone Study  Result Date: 02/18/2017 CLINICAL DATA:  Right flank pain and hematuria EXAM: CT ABDOMEN AND PELVIS WITHOUT CONTRAST TECHNIQUE: Multidetector CT imaging of the abdomen and pelvis was performed following the standard protocol without IV contrast. COMPARISON:  CT abdomen pelvis 02/13/2017 FINDINGS: Lower chest: No pulmonary nodules or pleural effusion. No visible pericardial effusion. Hepatobiliary: Normal hepatic contours and density. No visible biliary dilatation. Normal gallbladder. Pancreas: Normal contours without ductal dilatation. No peripancreatic fluid collection. Spleen: Normal. Adrenals/Urinary Tract: --Adrenal glands: Normal. --Right kidney/ureter: No hydronephrosis or perinephric stranding. No nephrolithiasis. No obstructing ureteral stones. --Left kidney/ureter: Unchanged left upper pole hypodense lesion measuring 2 cm. No hydronephrosis or perinephric stranding. No nephrolithiasis. No obstructing ureteral stones. --Urinary bladder: Unremarkable. Stomach/Bowel:  --Stomach/Duodenum: No hiatal hernia or other gastric abnormality. Normal duodenal course and caliber. --Small bowel: Small ventral abdominal hernia contains a loop of small bowel without evidence of obstruction or ischemia. --Colon: No focal abnormality. --Appendix: Normal. Vascular/Lymphatic: Normal course and caliber of the major abdominal vessels. No abdominal or pelvic lymphadenopathy. Reproductive: No free fluid in the pelvis. Musculoskeletal. Scattered small sclerotic foci throughout the pelvic skeleton are present on multiple prior examinations dating back to 2010. Other: None. IMPRESSION: 1. No obstructive uropathy or other acute abdominopelvic abnormality. 2. Unchanged appearance of low-attenuation structure at the upper pole the left kidney. The cyst remains the most likely possibility, but further investigation with a contrast-enhanced CT or MRI might be helpful, particularly in the context of hematuria. 3. Small ventral abdominal hernia containing a short segment of small bowel without obstruction or inflammation. 4. Unchanged scattered sclerotic foci in the pelvis compared to multiple remote examinations dating back to 2010. Electronically Signed   By: Ulyses Jarred M.D.   On: 02/18/2017  21:05    Procedures Procedures (including critical care time)  Medications Ordered in ED Medications  ketorolac (TORADOL) 30 MG/ML injection 30 mg (30 mg Intramuscular Given 02/19/17 0033)  oxyCODONE-acetaminophen (PERCOCET/ROXICET) 5-325 MG per tablet 1 tablet (1 tablet Oral Given 02/19/17 0032)     Initial Impression / Assessment and Plan / ED Course  I have reviewed the triage vital signs and the nursing notes.  Pertinent labs & imaging results that were available during my care of the patient were reviewed by me and considered in my medical decision making (see chart for details).     Patient presents with right flank pain.  Ongoing for the last several days.  Nontoxic on exam.  Vital signs are  reassuring.  No midline or bony tenderness.  Patient does have a history of prostate cancer; however, low suspicion for pathologic fracture at this time given location of pain over the flank and not midline.  CT scan reviewed from triage and shows no evidence of stones but does show a persistent cyst in the left kidney.  This is likely not clinically significant to today's visit but patient will need further outpatient imaging.  Discussed this with the patient and his wife.  There is no signs of rash to suggest shingles.  Given that the pain is worse with movement.  May be musculoskeletal in nature.  Patient much improved with pain medication.  And follow-up recommended with primary physician and urology.  After history, exam, and medical workup I feel the patient has been appropriately medically screened and is safe for discharge home. Pertinent diagnoses were discussed with the patient. Patient was given return precautions.   Final Clinical Impressions(s) / ED Diagnoses   Final diagnoses:  Right flank pain    New Prescriptions Discharge Medication List as of 02/19/2017  1:31 AM    START taking these medications   Details  cyclobenzaprine (FLEXERIL) 5 MG tablet Take 1 tablet (5 mg total) by mouth 2 (two) times daily as needed for muscle spasms., Starting Tue 02/19/2017, Print    naproxen (NAPROSYN) 500 MG tablet Take 1 tablet (500 mg total) by mouth 2 (two) times daily., Starting Tue 02/19/2017, Print         [place], Barbette Hair, MD 02/19/17 [phone]

## 2018-06-01 IMAGING — CT CT RENAL STONE PROTOCOL
2 of 4 series · 16 of 46 positions shown, 18 images · non-contrast
Comparison: CT abdomen pelvis 02/13/2017

CLINICAL DATA: Right flank pain and hematuria

EXAM:
CT ABDOMEN AND PELVIS WITHOUT CONTRAST
TECHNIQUE: Multidetector CT imaging of the abdomen and pelvis was performed
following the standard protocol without IV contrast.

[Series 2: axial st · axial · 0.74mm/px · z∈[+815,+1230]mm · 13 of 91 slices shown, 15 images]
[im 4/91  soft-tissue]
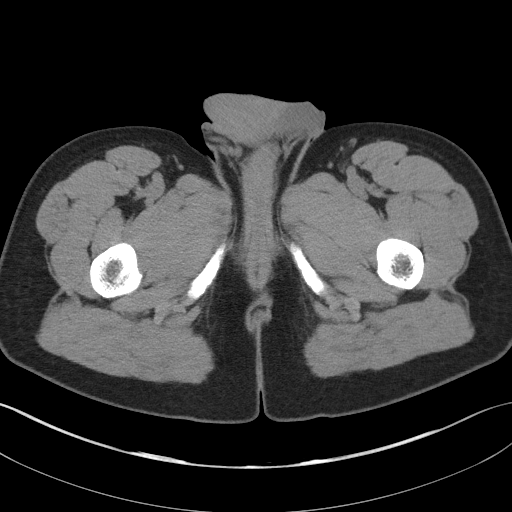
[im 4/91  bone]
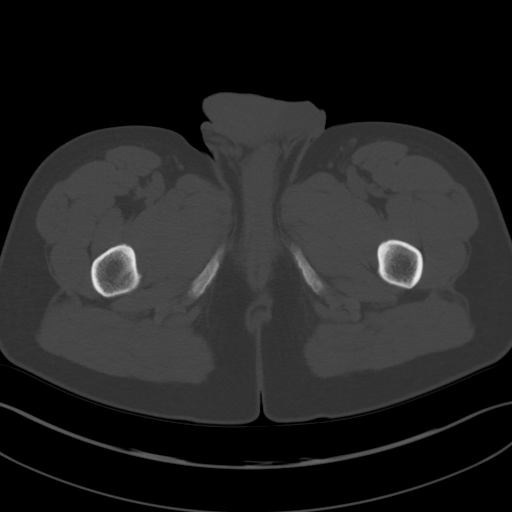
[im 11/91  soft-tissue]
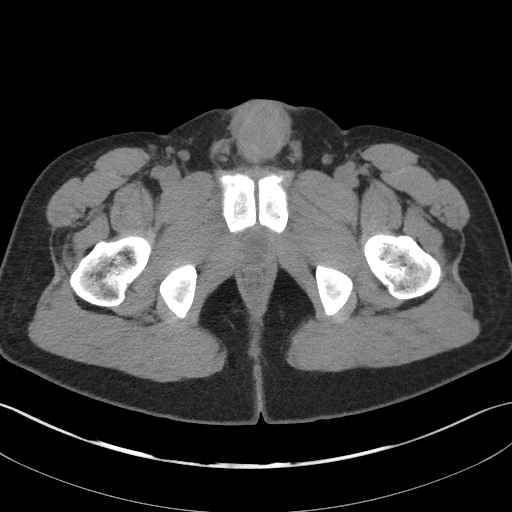
[im 19/91  soft-tissue]
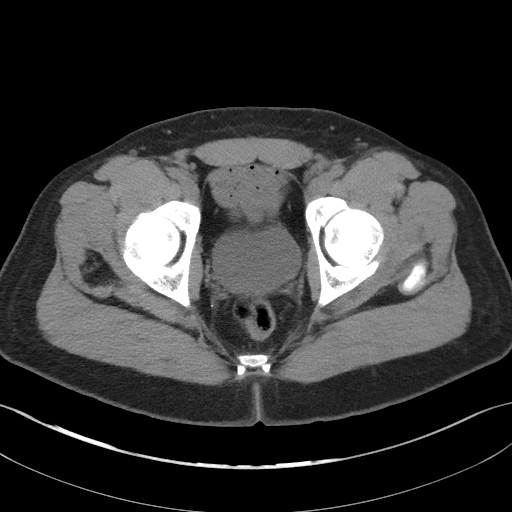
[im 26/91  soft-tissue]
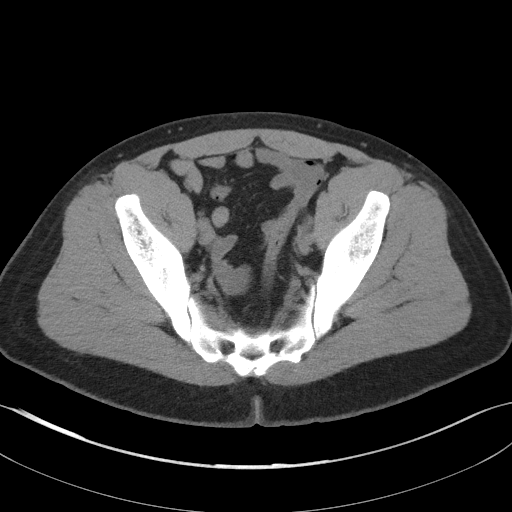
[im 33/91  soft-tissue]
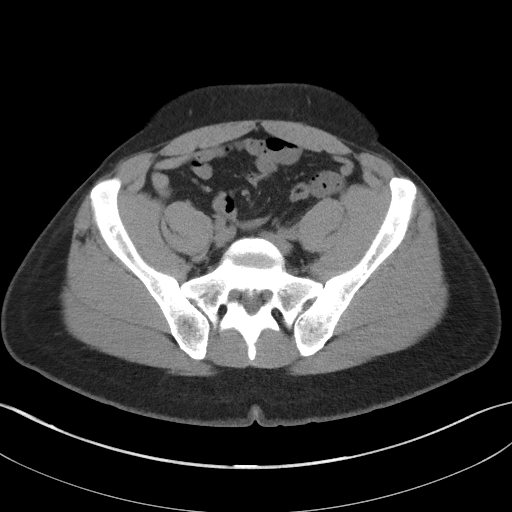
[im 40/91  soft-tissue]
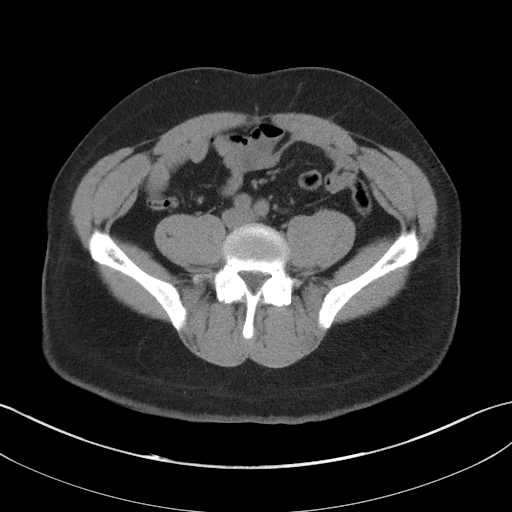
[im 47/91  soft-tissue]
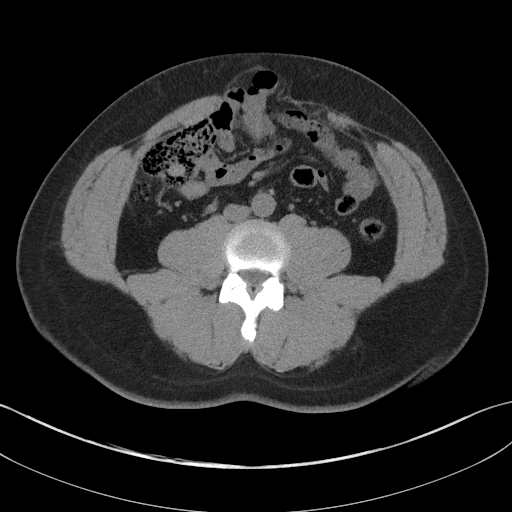
[im 51/91  soft-tissue]
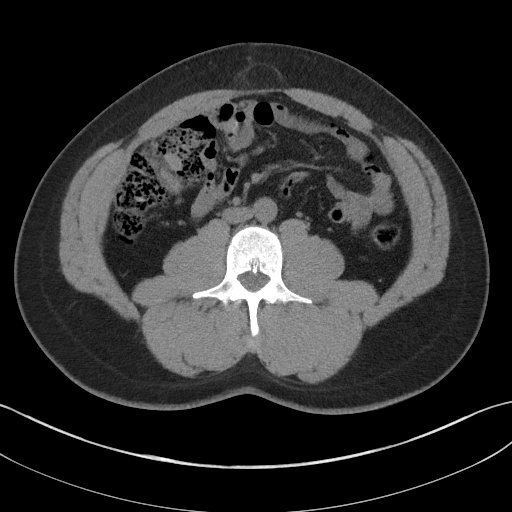
[im 58/91  soft-tissue]
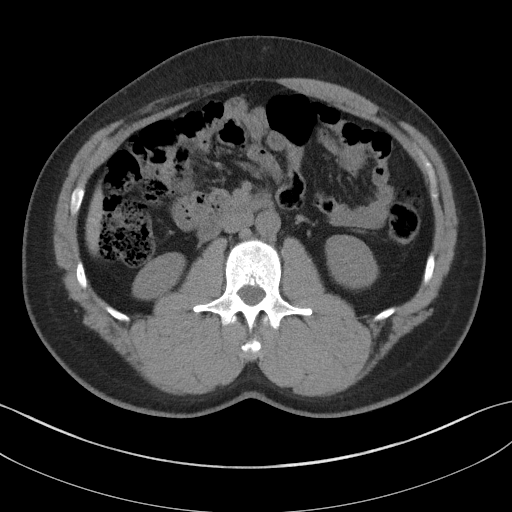
[im 58/91  bone]
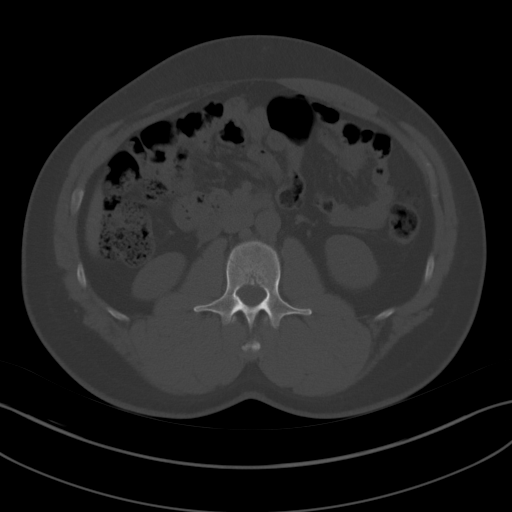
[im 65/91  soft-tissue]
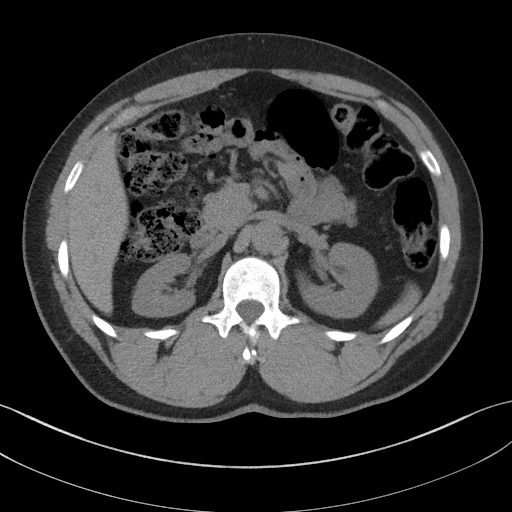
[im 73/91  soft-tissue]
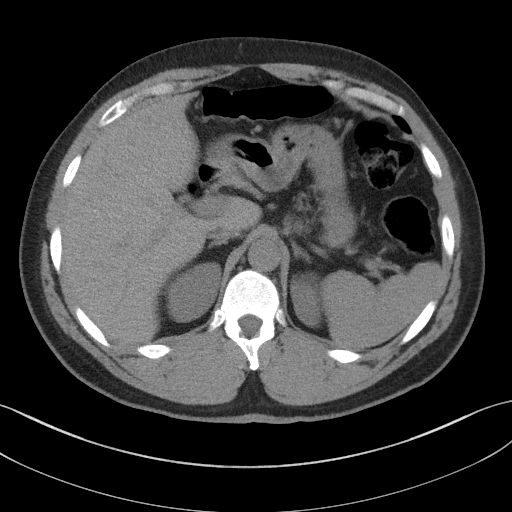
[im 80/91  soft-tissue]
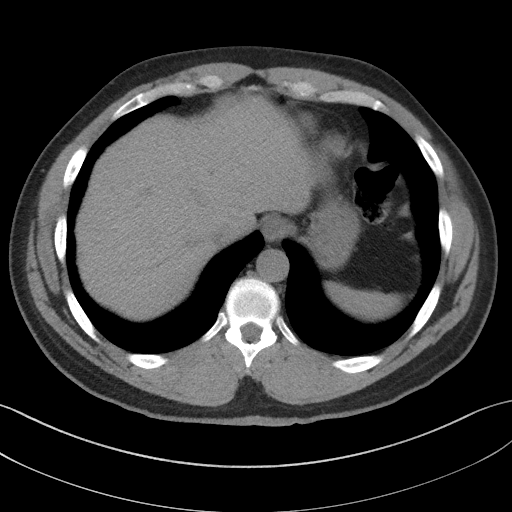
[im 87/91  soft-tissue]
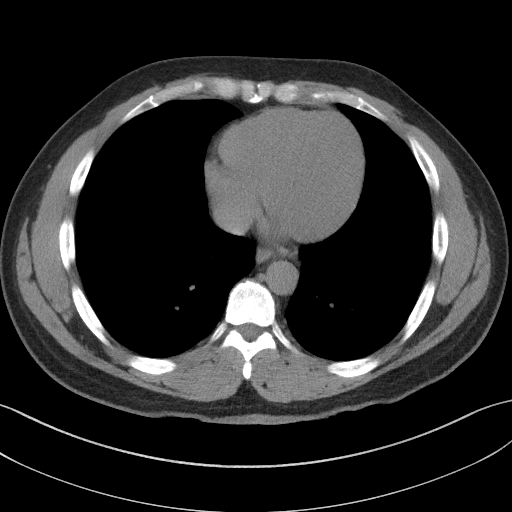

[Series 5: coronal st · coronal · 0.85mm/px · 3 of 91 slices shown]
[im 31/91  soft-tissue]
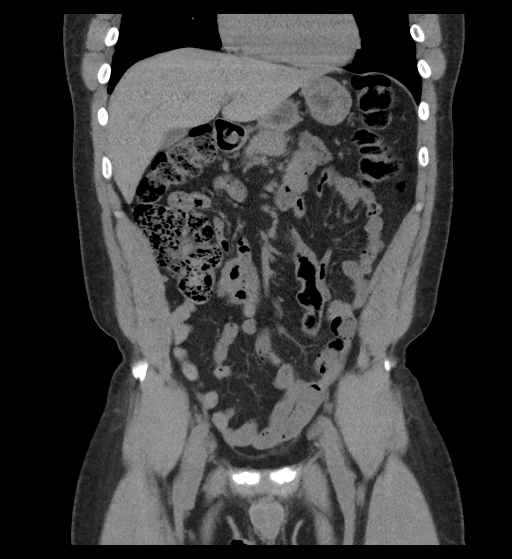
[im 41/91  soft-tissue]
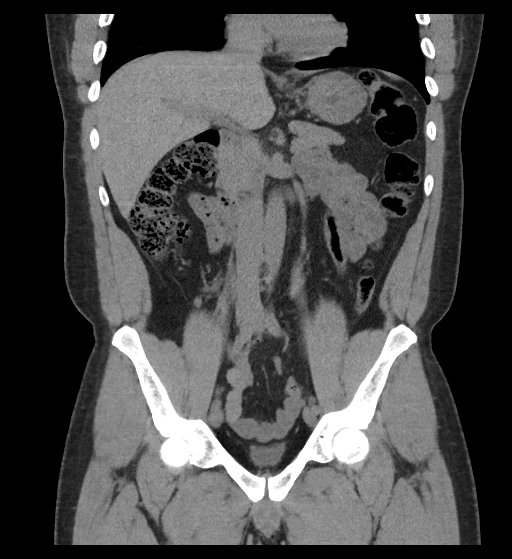
[im 51/91  soft-tissue]
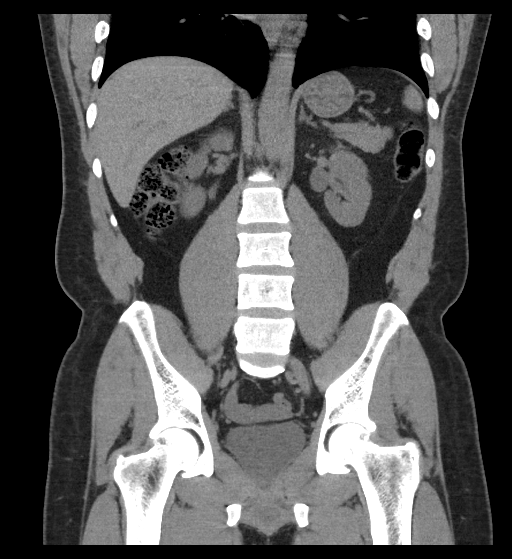

[16 of 46 positions shown; findings below may reference images not displayed]

FINDINGS: Lower chest: No pulmonary nodules or pleural effusion. No visible
pericardial effusion.

Hepatobiliary: Normal hepatic contours and density. No visible
biliary dilatation. Normal gallbladder.

Pancreas: Normal contours without ductal dilatation. No
peripancreatic fluid collection.

Spleen: Normal.

Adrenals/Urinary Tract:

--Adrenal glands: Normal.

--Right kidney/ureter: No hydronephrosis or perinephric stranding.
No nephrolithiasis. No obstructing ureteral stones.

--Left kidney/ureter: Unchanged left upper pole hypodense lesion
measuring 2 cm. No hydronephrosis or perinephric stranding. No
nephrolithiasis. No obstructing ureteral stones.

--Urinary bladder: Unremarkable.

Stomach/Bowel:

--Stomach/Duodenum: No hiatal hernia or other gastric abnormality.
Normal duodenal course and caliber.

--Small bowel: Small ventral abdominal hernia contains a loop of
small bowel without evidence of obstruction or ischemia.

--Colon: No focal abnormality.

--Appendix: Normal.

Vascular/Lymphatic: Normal course and caliber of the major abdominal
vessels. No abdominal or pelvic lymphadenopathy.

Reproductive: No free fluid in the pelvis.

Musculoskeletal. Scattered small sclerotic foci throughout the
pelvic skeleton are present on multiple prior examinations dating
back to 7818.

Other: None.
IMPRESSION: 1. No obstructive uropathy or other acute abdominopelvic
abnormality.
2. Unchanged appearance of low-attenuation structure at the upper
pole the left kidney. The cyst remains the most likely possibility,
but further investigation with a contrast-enhanced CT or MRI might
be helpful, particularly in the context of hematuria.
3. Small ventral abdominal hernia containing a short segment of
small bowel without obstruction or inflammation.
4. Unchanged scattered sclerotic foci in the pelvis compared to
multiple remote examinations dating back to 7818.

## 2019-05-04 ENCOUNTER — Other Ambulatory Visit: Payer: Self-pay

## 2019-05-04 ENCOUNTER — Emergency Department (HOSPITAL_BASED_OUTPATIENT_CLINIC_OR_DEPARTMENT_OTHER): Payer: 59

## 2019-05-04 ENCOUNTER — Emergency Department (HOSPITAL_BASED_OUTPATIENT_CLINIC_OR_DEPARTMENT_OTHER)
Admission: EM | Admit: 2019-05-04 | Discharge: 2019-05-04 | Disposition: A | Discharge status: Home / Self Care | Payer: 59 | Attending: Emergency Medicine | Admitting: Emergency Medicine

## 2019-05-04 ENCOUNTER — Encounter (HOSPITAL_BASED_OUTPATIENT_CLINIC_OR_DEPARTMENT_OTHER): Payer: Self-pay | Admitting: *Deleted

## 2019-05-04 DIAGNOSIS — I10 Essential (primary) hypertension: Secondary | ICD-10-CM | POA: Insufficient documentation

## 2019-05-04 DIAGNOSIS — Z91041 Radiographic dye allergy status: Secondary | ICD-10-CM | POA: Diagnosis not present

## 2019-05-04 DIAGNOSIS — R0602 Shortness of breath: Secondary | ICD-10-CM

## 2019-05-04 DIAGNOSIS — U071 COVID-19: Secondary | ICD-10-CM | POA: Diagnosis not present

## 2019-05-04 DIAGNOSIS — Z79899 Other long term (current) drug therapy: Secondary | ICD-10-CM | POA: Diagnosis not present

## 2019-05-04 DIAGNOSIS — E782 Mixed hyperlipidemia: Secondary | ICD-10-CM | POA: Insufficient documentation

## 2019-05-04 MED ORDER — PREDNISONE 10 MG PO TABS: 40.0000 mg | ORAL_TABLET | Freq: Every day | ORAL | 0 refills | Status: DC

## 2019-05-04 MED ORDER — PREDNISONE 10 MG PO TABS
60.0000 mg | ORAL_TABLET | Freq: Once | ORAL | Status: AC
  Administered 2019-05-04: 60 mg via ORAL
  Filled 2019-05-04: qty 1

## 2019-05-04 MED ORDER — ALBUTEROL SULFATE HFA 108 (90 BASE) MCG/ACT IN AERS
2.0000 | INHALATION_SPRAY | Freq: Four times a day (QID) | RESPIRATORY_TRACT | Status: DC
  Administered 2019-05-04: 2 via RESPIRATORY_TRACT
  Filled 2019-05-04: qty 6.7

## 2019-05-04 NOTE — Discharge Instructions (Signed)
Take the prednisone for the next 5 days.  If that seems to help contact your primary care provider for an extension and taper.  Also use the albuterol inhaler may help the shortness of breath and cough will not hurt.  2 puffs every 6 hours for the next 7 days.  Work note provided to be out of work for another week.  Your shortness of breath is probably residual symptoms related to COVID-19.  Oxygen levels were good here today chest x-ray was negative.

## 2019-05-04 NOTE — ED Triage Notes (Signed)
States he had Covid 14 days ago. He continues to have a cough and feels SOB. Oxygen saturation is 100% at triage.

## 2019-05-04 NOTE — ED Provider Notes (Signed)
[place] EMERGENCY DEPARTMENT Provider Note   CSN: [id number] Arrival date & time: 05/04/19  1535     History Chief Complaint  Patient presents with  . Shortness of Breath  . Cough    Cole Chung is a 59 y.o. male.  Patient diagnosed with a COVID-19 infection about 2 weeks ago.  Has been self quarantine.  Just today return to work.  Has had persistent shortness of breath throughout the illness not significantly worse today.  But since first day back at work was struggling to do his normal routine.  No leg swelling.  Still has residual cough.        Past Medical History:  Diagnosis Date  . Acid reflux   . Bronchitis   . Cancer ([place])   . Colon polyps   . High cholesterol   . Hypertension   . Prostate cancer ([place])   . Umbilical hernia     There are no problems to display for this patient.   Past Surgical History:  Procedure Laterality Date  . COLONOSCOPY W/ BIOPSIES AND POLYPECTOMY    . HERNIA REPAIR    . PROSTATE SURGERY    . TONSILLECTOMY         No family history on file.  Social History   Tobacco Use  . Smoking status: Never Smoker  . Smokeless tobacco: Never Used  Substance Use Topics  . Alcohol use: No  . Drug use: No    Home Medications Prior to Admission medications   Medication Sig Start Date End Date Taking? Authorizing Provider  amLODipine (NORVASC) 10 MG tablet Take 1 tablet (10 mg total) by mouth daily. 07/13/12  Yes Larene Pickett, PA-C  atorvastatin (LIPITOR) 40 MG tablet Take by mouth. 02/12/17  Yes [provider]  ergocalciferol (VITAMIN D2) 1.25 MG (50000 UT) capsule Take by mouth. 08/09/15  Yes [provider]  omeprazole (PRILOSEC) 40 MG capsule  08/10/15  Yes [provider]  telmisartan (MICARDIS) 40 MG tablet  11/11/18  Yes [provider]  chlorpheniramine-HYDROcodone (TUSSIONEX PENNKINETIC ER) 10-8 MG/5ML SUER Take 5 mLs by mouth every 12 (twelve) hours as needed for cough.  02/16/15   Molpus, John, MD  cyclobenzaprine (FLEXERIL) 5 MG tablet Take 1 tablet (5 mg total) by mouth 2 (two) times daily as needed for muscle spasms. 02/19/17   Horton, Barbette Hair, MD  fluticasone (FLONASE) 50 MCG/ACT nasal spray Place 2 sprays into the nose daily. 07/13/12   Larene Pickett, PA-C  naproxen (NAPROSYN) 500 MG tablet Take 1 tablet (500 mg total) by mouth 2 (two) times daily. 02/19/17   Horton, Barbette Hair, MD  ranitidine (ZANTAC) 150 MG tablet Take 150 mg by mouth 2 (two) times daily.    [provider]  traMADol (ULTRAM) 50 MG tablet Take 1 tablet (50 mg total) by mouth every 6 (six) hours as needed. 01/27/17   Lajean Saver, MD    Allergies    Iodine and Ivp dye [iodinated diagnostic agents]  Review of Systems   Review of Systems  Constitutional: Negative for chills and fever.  HENT: Negative for congestion, rhinorrhea and sore throat.   Eyes: Negative for visual disturbance.  Respiratory: Positive for cough and shortness of breath.   Cardiovascular: Negative for chest pain and leg swelling.  Gastrointestinal: Negative for abdominal pain, diarrhea, nausea and vomiting.  Genitourinary: Negative for dysuria.  Musculoskeletal: Negative for back pain and neck pain.  Skin: Negative for rash.  Neurological: Negative for  dizziness, light-headedness and headaches.  Hematological: Does not bruise/bleed easily.  Psychiatric/Behavioral: Negative for confusion.    Physical Exam Updated Vital Signs BP 139/89   Pulse 64   Temp 98.5 F (36.9 C) (Oral)   Resp 20   Ht 1.803 m (5\' 11" )   Wt 99.8 kg   SpO2 99%   BMI 30.68 kg/m   Physical Exam Vitals and nursing note reviewed.  Constitutional:      General: He is not in acute distress.    Appearance: Normal appearance. He is well-developed.  HENT:     Head: Normocephalic and atraumatic.  Eyes:     Extraocular Movements: Extraocular movements intact.     Conjunctiva/sclera: Conjunctivae normal.     Pupils:  Pupils are equal, round, and reactive to light.  Cardiovascular:     Rate and Rhythm: Normal rate and regular rhythm.     Heart sounds: No murmur.  Pulmonary:     Effort: Pulmonary effort is normal. No respiratory distress.     Breath sounds: Normal breath sounds.  Abdominal:     Palpations: Abdomen is soft.     Tenderness: There is no abdominal tenderness.  Musculoskeletal:        General: No swelling.     Cervical back: Normal range of motion and neck supple.  Skin:    General: Skin is warm and dry.  Neurological:     General: No focal deficit present.     Mental Status: He is alert and oriented to person, place, and time.     ED Results / Procedures / Treatments   Labs (all labs ordered are listed, but only abnormal results are displayed) Labs Reviewed - No data to display  EKG None  Radiology DG Chest Portable 1 View  Result Date: 05/04/2019 CLINICAL DATA:  Cough, shortness of breath EXAM: PORTABLE CHEST 1 VIEW COMPARISON:  07/26/2015 FINDINGS: The heart size and mediastinal contours are within normal limits. Both lungs are clear. The visualized skeletal structures are unremarkable. IMPRESSION: No acute cardiopulmonary findings. Electronically Signed   By: Davina Poke D.O.   On: 05/04/2019 16:21    Procedures Procedures (including critical care time)  Medications Ordered in ED Medications - No data to display  ED Course  I have reviewed the triage vital signs and the nursing notes.  Pertinent labs & imaging results that were available during my care of the patient were reviewed by me and considered in my medical decision making (see chart for details).    MDM Rules/Calculators/A&P                     Chest x-ray negative for any evidence of infiltrates.  Patient's oxygen saturation 99% on room air.  Heart rate 64.  Not febrile.  Respiratory rate 20.  Clinically not concerned about pulmonary embolus.  Patient's had persistent shortness of breath nothing new or  worse.  Suspect this is residual from the COVID-19 infection.  Will start patient on albuterol inhaler and a 5-day course of prednisone.  Have him follow-up with primary care doctor.  We will take him out of work for another week.  Patient nontoxic no acute distress.  Final Clinical Impression(s) / ED Diagnoses Final diagnoses:  None    Rx / DC Orders ED Discharge Orders    None       Fredia Sorrow, MD 05/04/19 1747

## 2020-06-27 ENCOUNTER — Emergency Department (HOSPITAL_BASED_OUTPATIENT_CLINIC_OR_DEPARTMENT_OTHER): Payer: 59

## 2020-06-27 ENCOUNTER — Emergency Department (HOSPITAL_BASED_OUTPATIENT_CLINIC_OR_DEPARTMENT_OTHER)
Admission: EM | Admit: 2020-06-27 | Discharge: 2020-06-27 | Disposition: A | Discharge status: Home / Self Care | Payer: 59 | Attending: Emergency Medicine | Admitting: Emergency Medicine

## 2020-06-27 ENCOUNTER — Other Ambulatory Visit: Payer: Self-pay

## 2020-06-27 ENCOUNTER — Encounter (HOSPITAL_BASED_OUTPATIENT_CLINIC_OR_DEPARTMENT_OTHER): Payer: Self-pay | Admitting: *Deleted

## 2020-06-27 DIAGNOSIS — Z79899 Other long term (current) drug therapy: Secondary | ICD-10-CM | POA: Diagnosis not present

## 2020-06-27 DIAGNOSIS — I10 Essential (primary) hypertension: Secondary | ICD-10-CM | POA: Insufficient documentation

## 2020-06-27 DIAGNOSIS — R319 Hematuria, unspecified: Secondary | ICD-10-CM | POA: Insufficient documentation

## 2020-06-27 DIAGNOSIS — M545 Low back pain, unspecified: Secondary | ICD-10-CM | POA: Insufficient documentation

## 2020-06-27 DIAGNOSIS — Z8546 Personal history of malignant neoplasm of prostate: Secondary | ICD-10-CM | POA: Insufficient documentation

## 2020-06-27 DIAGNOSIS — M549 Dorsalgia, unspecified: Secondary | ICD-10-CM

## 2020-06-27 DIAGNOSIS — R1032 Left lower quadrant pain: Secondary | ICD-10-CM | POA: Insufficient documentation

## 2020-06-27 LAB — CBC WITH DIFFERENTIAL/PLATELET
Abs Immature Granulocytes: 0.01 10*3/uL (ref 0.00–0.07)
Basophils Absolute: 0 10*3/uL (ref 0.0–0.1)
Basophils Relative: 0 %
Eosinophils Absolute: 0.2 10*3/uL (ref 0.0–0.5)
Eosinophils Relative: 4 %
HCT: 42.7 % (ref 39.0–52.0)
Hemoglobin: 15 g/dL (ref 13.0–17.0)
Immature Granulocytes: 0 %
Lymphocytes Relative: 52 %
Lymphs Abs: 2.8 10*3/uL (ref 0.7–4.0)
MCH: 27.9 pg (ref 26.0–34.0)
MCHC: 35.1 g/dL (ref 30.0–36.0)
MCV: 79.5 fL — ABNORMAL LOW (ref 80.0–100.0)
Monocytes Absolute: 0.6 10*3/uL (ref 0.1–1.0)
Monocytes Relative: 11 %
Neutro Abs: 1.8 10*3/uL (ref 1.7–7.7)
Neutrophils Relative %: 33 %
Platelets: 226 10*3/uL (ref 150–400)
RBC: 5.37 MIL/uL (ref 4.22–5.81)
RDW: 12.7 % (ref 11.5–15.5)
WBC: 5.5 10*3/uL (ref 4.0–10.5)
nRBC: 0 % (ref 0.0–0.2)

## 2020-06-27 LAB — BASIC METABOLIC PANEL
Anion gap: 9 (ref 5–15)
BUN: 18 mg/dL (ref 6–20)
CO2: 24 mmol/L (ref 22–32)
Calcium: 8.9 mg/dL (ref 8.9–10.3)
Chloride: 104 mmol/L (ref 98–111)
Creatinine, Ser: 1.31 mg/dL — ABNORMAL HIGH (ref 0.61–1.24)
GFR, Estimated: >60 mL/min (ref >60)
Glucose, Bld: 96 mg/dL (ref 70–99)
Potassium: 3.8 mmol/L (ref 3.5–5.1)
Sodium: 137 mmol/L (ref 135–145)

## 2020-06-27 MED ORDER — HYDROCODONE-ACETAMINOPHEN 5-325 MG PO TABS: 1.0000 | ORAL_TABLET | Freq: Four times a day (QID) | ORAL | 0 refills | Status: AC | PRN

## 2020-06-27 NOTE — ED Triage Notes (Signed)
Back pain x 2 months. He had xrays last month that were negative.

## 2020-06-27 NOTE — ED Provider Notes (Signed)
He is [place] EMERGENCY DEPARTMENT Provider Note   CSN: [id number] Arrival date & time: 06/27/20  1933     History Chief Complaint  Patient presents with  . Back Pain    Cole Chung is a 60 y.o. male.  The history is provided by the patient and medical records.  Back Pain  Cole Chung is a 60 y.o. male who presents to the Emergency Department complaining of back pain. He presents the emergency department complaining of 2 1/2 months of progressive lumbar back pain. Pain is located in the mid lower back and radiates to the sides bilaterally. It is constant but worse with movement, walking. He states that it is progressively worsened and he presents today for evaluation. He has seen his PCP for this and they ordered a CT scan but has not been able to have it done yet. He denies any fevers, chest pain, nausea, vomiting. He does have occasional left lower quadrant discomfort. He also reports occasional intermittent hematuria. He does have a history of prostate cancer status post resection but he does still have some residual tumor for which he receives surveillance. He denies any numbness, weakness, fevers, night sweats, weight loss, incontinence.    Past Medical History:  Diagnosis Date  . Acid reflux   . Bronchitis   . Cancer ([place])   . Colon polyps   . High cholesterol   . Hypertension   . Prostate cancer ([place])   . Umbilical hernia     There are no problems to display for this patient.   Past Surgical History:  Procedure Laterality Date  . COLONOSCOPY W/ BIOPSIES AND POLYPECTOMY    . HERNIA REPAIR    . PROSTATE SURGERY    . TONSILLECTOMY         No family history on file.  Social History   Tobacco Use  . Smoking status: Never Smoker  . Smokeless tobacco: Never Used  Substance Use Topics  . Alcohol use: No  . Drug use: No    Home Medications Prior to Admission medications   Medication Sig Start Date End Date Taking? Authorizing Provider  amLODipine  (NORVASC) 10 MG tablet Take 1 tablet (10 mg total) by mouth daily. 07/13/12  Yes Larene Pickett, PA-C  atorvastatin (LIPITOR) 40 MG tablet Take by mouth. 02/12/17  Yes [provider]  omeprazole (PRILOSEC) 40 MG capsule  08/10/15  Yes [provider]  telmisartan (MICARDIS) 40 MG tablet  11/11/18  Yes [provider]  traMADol (ULTRAM) 50 MG tablet Take 1 tablet (50 mg total) by mouth every 6 (six) hours as needed. 01/27/17  Yes Lajean Saver, MD  chlorpheniramine-HYDROcodone [facility] PENNKINETIC ER) 10-8 MG/5ML SUER Take 5 mLs by mouth every 12 (twelve) hours as needed for cough. 02/16/15   Molpus, John, MD  cyclobenzaprine (FLEXERIL) 5 MG tablet Take 1 tablet (5 mg total) by mouth 2 (two) times daily as needed for muscle spasms. 02/19/17   Horton, Barbette Hair, MD  ergocalciferol (VITAMIN D2) 1.25 MG (50000 UT) capsule Take by mouth. 08/09/15   [provider]  fluticasone (FLONASE) 50 MCG/ACT nasal spray Place 2 sprays into the nose daily. 07/13/12   Larene Pickett, PA-C  naproxen (NAPROSYN) 500 MG tablet Take 1 tablet (500 mg total) by mouth 2 (two) times daily. 02/19/17   Horton, Barbette Hair, MD  predniSONE (DELTASONE) 10 MG tablet Take 4 tablets (40 mg total) by mouth daily. 05/04/19   Fredia Sorrow, MD  ranitidine (  ZANTAC) 150 MG tablet Take 150 mg by mouth 2 (two) times daily.    [provider]    Allergies    Iodine and Ivp dye [iodinated diagnostic agents]  Review of Systems   Review of Systems  Musculoskeletal: Positive for back pain.  All other systems reviewed and are negative.   Physical Exam Updated Vital Signs BP (!) 164/91 (BP Location: Right Arm)   Pulse (!) 51   Temp 97.9 F (36.6 C) (Oral)   Resp 14   Ht 5\' 11"  (1.803 m)   Wt 93.9 kg   SpO2 97%   BMI 28.87 kg/m   Physical Exam Vitals and nursing note reviewed.  Constitutional:      Appearance: He is well-developed and well-nourished.  HENT:     Head:  Normocephalic and atraumatic.  Cardiovascular:     Rate and Rhythm: Normal rate and regular rhythm.  Pulmonary:     Effort: Pulmonary effort is normal. No respiratory distress.  Abdominal:     Palpations: Abdomen is soft.     Tenderness: There is no abdominal tenderness. There is no guarding or rebound.  Musculoskeletal:        General: No tenderness or edema.     Comments: 2+ DP pulses bilaterally.  Skin:    General: Skin is warm and dry.  Neurological:     Mental Status: He is alert and oriented to person, place, and time.     Comments: Five out of five strength and bilateral lower extremities with sensation to light touch intact in bilateral lower extremities  Psychiatric:        Mood and Affect: Mood and affect normal.        Behavior: Behavior normal.     ED Results / Procedures / Treatments   Labs (all labs ordered are listed, but only abnormal results are displayed) Labs Reviewed - No data to display  EKG None  Radiology No results found.  Procedures Procedures   Medications Ordered in ED Medications - No data to display  ED Course  I have reviewed the triage vital signs and the nursing notes.  Pertinent labs & imaging results that were available during my care of the patient were reviewed by me and considered in my medical decision making (see chart for details).    MDM Rules/Calculators/A&P                         patient with history of prostate cancer here for evaluation of progressive low back pain. He is neurologically intact on evaluation and has no urinary symptoms. And is negative for acute process. BMP similar when compared to priors. Discussed with patient unclear source of pain, possible musculoskeletal pain. Discussed importance of PCP follow-up for further evaluation as well as return precautions. Presentation is not consistent with epidural abscess, sepsis, cauda equina.  Final Clinical Impression(s) / ED Diagnoses Final diagnoses:  Back pain     Rx / DC Orders ED Discharge Orders    None       Quintella Reichert, MD 06/27/20 2321

## 2021-03-09 ENCOUNTER — Other Ambulatory Visit: Payer: Self-pay | Admitting: Family Medicine

## 2021-03-09 ENCOUNTER — Other Ambulatory Visit: Payer: Self-pay

## 2021-03-09 ENCOUNTER — Ambulatory Visit: Payer: Self-pay

## 2021-03-09 DIAGNOSIS — M25562 Pain in left knee: Secondary | ICD-10-CM

## 2024-03-08 ENCOUNTER — Emergency Department (HOSPITAL_BASED_OUTPATIENT_CLINIC_OR_DEPARTMENT_OTHER): Payer: Self-pay

## 2024-03-08 ENCOUNTER — Emergency Department (HOSPITAL_BASED_OUTPATIENT_CLINIC_OR_DEPARTMENT_OTHER)
Admission: EM | Admit: 2024-03-08 | Discharge: 2024-03-08 | Disposition: A | Discharge status: Home / Self Care | Payer: Self-pay | Attending: Emergency Medicine | Admitting: Emergency Medicine

## 2024-03-08 ENCOUNTER — Encounter (HOSPITAL_BASED_OUTPATIENT_CLINIC_OR_DEPARTMENT_OTHER): Payer: Self-pay | Admitting: Emergency Medicine

## 2024-03-08 ENCOUNTER — Other Ambulatory Visit: Payer: Self-pay

## 2024-03-08 DIAGNOSIS — Z79899 Other long term (current) drug therapy: Secondary | ICD-10-CM | POA: Insufficient documentation

## 2024-03-08 DIAGNOSIS — J40 Bronchitis, not specified as acute or chronic: Secondary | ICD-10-CM | POA: Insufficient documentation

## 2024-03-08 DIAGNOSIS — Z8546 Personal history of malignant neoplasm of prostate: Secondary | ICD-10-CM | POA: Insufficient documentation

## 2024-03-08 DIAGNOSIS — I10 Essential (primary) hypertension: Secondary | ICD-10-CM | POA: Insufficient documentation

## 2024-03-08 LAB — RESP PANEL BY RT-PCR (RSV, FLU A&B, COVID)  RVPGX2
Influenza A by PCR: NEGATIVE
Influenza B by PCR: NEGATIVE
Resp Syncytial Virus by PCR: NEGATIVE
SARS Coronavirus 2 by RT PCR: NEGATIVE

## 2024-03-08 MED ORDER — METHYLPREDNISOLONE SODIUM SUCC 40 MG IJ SOLR
40.0000 mg | Freq: Once | INTRAMUSCULAR | Status: AC
  Administered 2024-03-08: 40 mg via INTRAMUSCULAR
  Filled 2024-03-08: qty 1

## 2024-03-08 MED ORDER — ALBUTEROL SULFATE HFA 108 (90 BASE) MCG/ACT IN AERS
1.0000 | INHALATION_SPRAY | Freq: Once | RESPIRATORY_TRACT | Status: AC
  Administered 2024-03-08: 1 via RESPIRATORY_TRACT
  Filled 2024-03-08: qty 6.7

## 2024-03-08 MED ORDER — PREDNISONE 20 MG PO TABS: 40.0000 mg | ORAL_TABLET | Freq: Every day | ORAL | 0 refills | Status: AC

## 2024-03-08 MED ORDER — BENZONATATE 100 MG PO CAPS: 100.0000 mg | ORAL_CAPSULE | Freq: Three times a day (TID) | ORAL | 0 refills | Status: AC

## 2024-03-08 MED ORDER — DOXYCYCLINE HYCLATE 100 MG PO CAPS: 100.0000 mg | ORAL_CAPSULE | Freq: Two times a day (BID) | ORAL | 0 refills | Status: AC

## 2024-03-08 NOTE — ED Notes (Signed)
 Patient transferred from waiting room to ED treatment room. Assuming pt care at this time.

## 2024-03-08 NOTE — ED Provider Notes (Signed)
 [place] EMERGENCY DEPARTMENT AT [facility] Provider Note   CSN: [id number] Arrival date & time: 03/08/24  1711     Patient presents with: Cough   Cole Chung is a 63 y.o. male.  Patient is a 63 year old male with history of hypertension, prostate cancer, GERD who presents to the ED for increasing cough/congestion for the past week and a half.  Patient notes he has had a dry cough for approximately 1 month but symptoms have worsened in the past week.  He has tried Mucinex once with minimal relief.  Notes he has not been seen for this cough previously.  States he is now starting to get some cramping in the abdomen with coughing.  Notes he has had some wheezing but has not been using his inhaler.  Denies fevers, chills, headache, chest pain, shortness of breath, hematemesis, nausea/vomiting/diarrhea.  No further complaints.  Cough Associated symptoms: wheezing   Associated symptoms: no chest pain, no chills, no fever, no headaches and no shortness of breath        Prior to Admission medications   Medication Sig Start Date End Date Taking? Authorizing Provider  benzonatate (TESSALON) 100 MG capsule Take 1 capsule (100 mg total) by mouth every 8 (eight) hours. 03/08/24  Yes Azaylea Maves, Thersia RAMAN, PA-C  doxycycline (VIBRAMYCIN) 100 MG capsule Take 1 capsule (100 mg total) by mouth 2 (two) times daily for 7 days. 03/08/24 03/15/24 Yes Aleksandar Duve, Thersia RAMAN, PA-C  predniSONE  (DELTASONE ) 20 MG tablet Take 2 tablets (40 mg total) by mouth daily for 5 days. 03/08/24 03/13/24 Yes Printice Hellmer, Thersia RAMAN, PA-C  amLODipine  (NORVASC ) 10 MG tablet Take 1 tablet (10 mg total) by mouth daily. 07/13/12   Jarold Olam HERO, PA-C  atorvastatin (LIPITOR) 40 MG tablet Take by mouth. 02/12/17   [provider]  chlorpheniramine-HYDROcodone  (TUSSIONEX PENNKINETIC ER) 10-8 MG/5ML SUER Take 5 mLs by mouth every 12 (twelve) hours as needed for cough. 02/16/15   Molpus, John, MD  cyclobenzaprine  (FLEXERIL ) 5 MG  tablet Take 1 tablet (5 mg total) by mouth 2 (two) times daily as needed for muscle spasms. 02/19/17   Horton, Charmaine FALCON, MD  ergocalciferol (VITAMIN D2) 1.25 MG (50000 UT) capsule Take by mouth. 08/09/15   [provider]  fluticasone  (FLONASE ) 50 MCG/ACT nasal spray Place 2 sprays into the nose daily. 07/13/12   Jarold Olam HERO, PA-C  gabapentin (NEURONTIN) 100 MG capsule Take 100 mg by mouth 2 (two) times daily. 06/06/20   [provider]  HYDROcodone -acetaminophen  (NORCO/VICODIN) 5-325 MG tablet Take 1 tablet by mouth every 6 (six) hours as needed. 06/27/20   Griselda Norris, MD  montelukast (SINGULAIR) 10 MG tablet Take 10 mg by mouth daily. 04/01/20   [provider]  naproxen  (NAPROSYN ) 500 MG tablet Take 1 tablet (500 mg total) by mouth 2 (two) times daily. 02/19/17   Bari Charmaine FALCON, MD  omeprazole (PRILOSEC) 40 MG capsule  08/10/15   [provider]  pravastatin (PRAVACHOL) 40 MG tablet Take 40 mg by mouth daily. 06/06/20   [provider]  ranitidine (ZANTAC) 150 MG tablet Take 150 mg by mouth 2 (two) times daily.    [provider]  telmisartan (MICARDIS) 40 MG tablet  11/11/18   [provider]  traMADol  (ULTRAM ) 50 MG tablet Take 1 tablet (50 mg total) by mouth every 6 (six) hours as needed. 01/27/17   Steinl, Kevin, MD    Allergies: Iodine and Ivp dye [iodinated contrast media]  Review of Systems  Constitutional:  Negative for chills and fever.  Respiratory:  Positive for cough and wheezing. Negative for shortness of breath.   Cardiovascular:  Negative for chest pain.  Gastrointestinal:  Negative for abdominal pain, diarrhea, nausea and vomiting.  Neurological:  Negative for headaches.  All other systems reviewed and are negative.   Updated Vital Signs BP (!) 134/95 (BP Location: Right Arm)   Pulse 63   Temp 98.4 F (36.9 C) (Oral)   Resp 20   Ht 5' 11 (1.803 m)   Wt 99.8 kg   SpO2 98%   BMI 30.68 kg/m    Physical Exam Constitutional:      Appearance: Normal appearance.  HENT:     Head: Normocephalic and atraumatic.     Nose: Nose normal.     Mouth/Throat:     Mouth: Mucous membranes are moist.     Pharynx: Oropharynx is clear.  Cardiovascular:     Rate and Rhythm: Normal rate.     Heart sounds: Normal heart sounds.  Pulmonary:     Effort: Pulmonary effort is normal. No respiratory distress.     Breath sounds: Normal breath sounds. No wheezing.  Abdominal:     General: Bowel sounds are normal.     Palpations: Abdomen is soft.     Tenderness: There is no abdominal tenderness.  Musculoskeletal:        General: Normal range of motion.  Skin:    General: Skin is warm and dry.  Neurological:     Mental Status: He is alert and oriented to person, place, and time.  Psychiatric:        Mood and Affect: Mood normal.        Behavior: Behavior normal.     (all labs ordered are listed, but only abnormal results are displayed) Labs Reviewed  RESP PANEL BY RT-PCR (RSV, FLU A&B, COVID)  [id number]    EKG: None  Radiology: DG Chest 2 View Result Date: 03/08/2024 EXAM: 2 VIEW(S) XRAY OF THE CHEST 03/08/2024 05:43:27 PM COMPARISON: 05/04/2019 CLINICAL HISTORY: cough FINDINGS: LUNGS AND PLEURA: No focal pulmonary opacity. No pleural effusion. No pneumothorax. HEART AND MEDIASTINUM: No acute abnormality of the cardiac and mediastinal silhouettes. BONES AND SOFT TISSUES: No acute osseous abnormality. IMPRESSION: 1. No acute cardiopulmonary process identified. Electronically signed by: Dayne Hassell MD 03/08/2024 05:47 PM EST RP Workstation: [id number]     Medications Ordered in the ED  methylPREDNISolone sodium succinate (SOLU-MEDROL) 40 mg/mL injection 40 mg (40 mg Intramuscular Given 03/08/24 1820)  albuterol  (VENTOLIN  HFA) 108 (90 Base) MCG/ACT inhaler 1 puff (1 puff Inhalation Given 03/08/24 1815)                                   Medical Decision Making Amount and/or  Complexity of Data Reviewed Radiology: ordered.  Risk Prescription drug management.   Patient is a 63 year old male who presents to the ED for increasing cough for the past week and a half.  Notes a chronic cough previously for the past 2 months.  Please see detailed HPI above.  On exam patient is alert and in no acute distress.  Physical exam as noted above.  Lungs are clear to auscultation with no retractions or respiratory distress.  Vital signs stable including normal heart rate and oxygen 100% on room air.  Chest x-ray reviewed and was negative for any acute consolidation or process.  Viral  swab pending.  Differential includes acute viral illness, asthma exacerbation, chronic bronchitis, pneumonia, PE, lung mass.  Due to increased acuity of symptoms over the past week, suspect patient's symptoms are viral in etiology versus at related.  No concerns for emergent imaging as patient is well-appearing with stable vital signs.  No alarm symptoms reported including dyspnea or hematemesis.  Stable for discharge home.  Plan to treat empirically for bronchitis due to underlying asthma and ongoing symptoms for a couple weeks.  Patient given albuterol  inhaler while in ED as well as Solu-Medrol.  Prescribed doxycycline, prednisone , Tessalon.  Symptomatic care discussed.  Advised PCP follow-up in 1 week if no improvement.  Return precautions provided for worsening symptoms.   Final diagnoses:  Bronchitis    ED Discharge Orders          Ordered    doxycycline (VIBRAMYCIN) 100 MG capsule  2 times daily        03/08/24 1811    predniSONE  (DELTASONE ) 20 MG tablet  Daily        03/08/24 1811    benzonatate (TESSALON) 100 MG capsule  Every 8 hours        03/08/24 1811               Neysa Thersia RAMAN, PA-C 03/08/24 1821    Dreama Longs, MD 03/09/24 2113

## 2024-03-08 NOTE — Discharge Instructions (Signed)
 Begin taking prednisone  and doxycycline as prescribed.  May take Tessalon 3 times a day as needed for cough.  Use albuterol  inhaler every 4-6 hours as needed for any chest tightness/wheezing.  Stay hydrated and drink fluids.  Follow-up with PCP in 1 week if no improvement.  Return to ED sooner if any symptoms worsen including chest pain, shortness of breath, uncontrolled fevers, uncontrolled nausea/vomiting.

## 2024-03-08 NOTE — ED Triage Notes (Signed)
 Pt c/o cough x 2 mos; NAD, denies other sxs
